# Patient Record
Sex: Male | Born: 1980
Health system: Southern US, Community
[De-identification: ages and names within clinical notes are randomized; demographics above are authoritative.]

## PROBLEM LIST (undated history)

## (undated) DIAGNOSIS — I10 Essential (primary) hypertension: Secondary | ICD-10-CM

## (undated) DIAGNOSIS — E119 Type 2 diabetes mellitus without complications: Secondary | ICD-10-CM

## (undated) HISTORY — DX: Type 2 diabetes mellitus without complications: E11.9

---

## 2004-02-05 ENCOUNTER — Emergency Department (HOSPITAL_COMMUNITY): Admission: EM | Admit: 2004-02-05 | Discharge: 2004-02-05 | Payer: Self-pay | Admitting: Family Medicine

## 2008-12-05 ENCOUNTER — Emergency Department (HOSPITAL_COMMUNITY): Admission: EM | Admit: 2008-12-05 | Discharge: 2008-12-05 | Payer: Self-pay | Admitting: Family Medicine

## 2009-06-19 ENCOUNTER — Emergency Department (HOSPITAL_COMMUNITY): Admission: EM | Admit: 2009-06-19 | Discharge: 2009-06-19 | Payer: Self-pay | Admitting: Family Medicine

## 2010-09-05 ENCOUNTER — Emergency Department (HOSPITAL_COMMUNITY): Payer: BC Managed Care – PPO

## 2010-09-05 ENCOUNTER — Emergency Department (HOSPITAL_COMMUNITY)
Admission: EM | Admit: 2010-09-05 | Discharge: 2010-09-05 | Disposition: A | Discharge status: Home / Self Care | Payer: BC Managed Care – PPO | Attending: Emergency Medicine | Admitting: Emergency Medicine

## 2010-09-05 DIAGNOSIS — IMO0002 Reserved for concepts with insufficient information to code with codable children: Secondary | ICD-10-CM | POA: Insufficient documentation

## 2010-09-05 DIAGNOSIS — M25519 Pain in unspecified shoulder: Secondary | ICD-10-CM | POA: Insufficient documentation

## 2010-09-05 DIAGNOSIS — S0003XA Contusion of scalp, initial encounter: Secondary | ICD-10-CM | POA: Insufficient documentation

## 2010-09-05 DIAGNOSIS — Z23 Encounter for immunization: Secondary | ICD-10-CM | POA: Insufficient documentation

## 2010-09-05 DIAGNOSIS — S1093XA Contusion of unspecified part of neck, initial encounter: Secondary | ICD-10-CM | POA: Insufficient documentation

## 2010-09-05 DIAGNOSIS — S0990XA Unspecified injury of head, initial encounter: Secondary | ICD-10-CM | POA: Insufficient documentation

## 2012-08-12 ENCOUNTER — Emergency Department (HOSPITAL_COMMUNITY)
Admission: EM | Admit: 2012-08-12 | Discharge: 2012-08-13 | Disposition: A | Discharge status: Home / Self Care | Payer: BC Managed Care – PPO | Attending: Emergency Medicine | Admitting: Emergency Medicine

## 2012-08-12 ENCOUNTER — Encounter (HOSPITAL_COMMUNITY): Payer: Self-pay | Admitting: Emergency Medicine

## 2012-08-12 ENCOUNTER — Emergency Department (HOSPITAL_COMMUNITY): Payer: BC Managed Care – PPO

## 2012-08-12 DIAGNOSIS — Z720 Tobacco use: Secondary | ICD-10-CM

## 2012-08-12 DIAGNOSIS — F17200 Nicotine dependence, unspecified, uncomplicated: Secondary | ICD-10-CM | POA: Insufficient documentation

## 2012-08-12 DIAGNOSIS — R0789 Other chest pain: Secondary | ICD-10-CM | POA: Insufficient documentation

## 2012-08-12 DIAGNOSIS — R739 Hyperglycemia, unspecified: Secondary | ICD-10-CM

## 2012-08-12 DIAGNOSIS — I10 Essential (primary) hypertension: Secondary | ICD-10-CM | POA: Insufficient documentation

## 2012-08-12 DIAGNOSIS — R7309 Other abnormal glucose: Secondary | ICD-10-CM | POA: Insufficient documentation

## 2012-08-12 HISTORY — DX: Essential (primary) hypertension: I10

## 2012-08-12 LAB — CBC
HCT: 41.5 % (ref 39.0–52.0)
Hemoglobin: 14.7 g/dL (ref 13.0–17.0)
MCH: 31.3 pg (ref 26.0–34.0)
MCHC: 35.4 g/dL (ref 30.0–36.0)
MCV: 88.5 fL (ref 78.0–100.0)

## 2012-08-12 LAB — BASIC METABOLIC PANEL
BUN: 13 mg/dL (ref 6–23)
CO2: 25 mEq/L (ref 19–32)
Calcium: 9.2 mg/dL (ref 8.4–10.5)
Creatinine, Ser: 0.82 mg/dL (ref 0.50–1.35)
GFR calc non Af Amer: >90 mL/min (ref >90)
Glucose, Bld: 139 mg/dL — ABNORMAL HIGH (ref 70–99)

## 2012-08-12 NOTE — ED Notes (Signed)
PT. REPORTS LEFT CHEST PAIN ONSET LAST NIGHT , DENIES SOB , NO NAUSEA OR DIAPHORESIS .

## 2012-08-12 NOTE — ED Provider Notes (Signed)
History    CSN: [id number] Arrival date & time 08/12/12  2019  First MD Initiated Contact with Patient 08/12/12 2348     Chief Complaint  Patient presents with  . Chest Pain   (Consider location/radiation/quality/duration/timing/severity/associated sxs/prior Treatment) Patient is a 32 y.o. male presenting with chest pain. The history is provided by the patient.  Chest Pain He has been having episodes of chest pain since yesterday afternoon. Pain will last for anywhere from 5-20 seconds before resolving. It is a sharp pain in the left anterior chest without radiation. He rates the pain at 10/10 when present. Nothing makes the pain worse, but it is better with walking. There is no associated dyspnea, nausea, diaphoresis. He is a cigarette smoker admitting to smoking one pack of cigarettes a day. He has history of hypertension which was diagnosed 2 years ago but has never been on any medications. When he has had his blood pressure checked, it has always been elevated although he cannot recall specific numbers. He denies history of diabetes, hyperlipidemia, and denies family history of coronary artery disease. Past Medical History  Diagnosis Date  . Hypertension    History reviewed. No pertinent past surgical history. No family history on file. History  Substance Use Topics  . Smoking status: Current Every Day Smoker  . Smokeless tobacco: Not on file  . Alcohol Use: Yes    Review of Systems  Cardiovascular: Positive for chest pain.  All other systems reviewed and are negative.    Allergies  Review of patient's allergies indicates no known allergies.  Home Medications  No current outpatient prescriptions on file. BP 188/85  Pulse 98  Temp(Src) 97.9 F (36.6 C) (Oral)  Resp 14  SpO2 98% Physical Exam  Nursing note and vitals reviewed.  32 year old male, resting comfortably and in no acute distress. Vital signs are significant for hypertension with blood pressure 188/85.  Oxygen saturation is 98%, which is normal. Head is normocephalic and atraumatic. PERRLA, EOMI. Oropharynx is clear. Neck is nontender and supple without adenopathy or JVD. Back is nontender and there is no CVA tenderness. Lungs are clear without rales, wheezes, or rhonchi. Chest is nontender. Heart has regular rate and rhythm without murmur. Abdomen is soft, flat, nontender without masses or hepatosplenomegaly and peristalsis is normoactive. Extremities have no cyanosis or edema, full range of motion is present. Skin is warm and dry without rash. Neurologic: Mental status is normal, cranial nerves are intact, there are no motor or sensory deficits.  ED Course  Procedures (including critical care time) Results for orders placed during the hospital encounter of 08/12/12  CBC      Result Value Range   WBC 6.0  4.0 - 10.5 K/uL   RBC 4.69  4.22 - 5.81 MIL/uL   Hemoglobin 14.7  13.0 - 17.0 g/dL   HCT 40.9  81.1 - 91.4 %   MCV 88.5  78.0 - 100.0 fL   MCH 31.3  26.0 - 34.0 pg   MCHC 35.4  30.0 - 36.0 g/dL   RDW 78.2  95.6 - 21.3 %   Platelets 158  150 - 400 K/uL  BASIC METABOLIC PANEL      Result Value Range   Sodium 140  135 - 145 mEq/L   Potassium 3.5  3.5 - 5.1 mEq/L   Chloride 104  96 - 112 mEq/L   CO2 25  19 - 32 mEq/L   Glucose, Bld 139 (*) 70 - 99 mg/dL  BUN 13  6 - 23 mg/dL   Creatinine, Ser 0.98  0.50 - 1.35 mg/dL   Calcium 9.2  8.4 - 11.9 mg/dL   GFR calc non Af Amer >90  >90 mL/min   GFR calc Af Amer >90  >90 mL/min  PRO B NATRIURETIC PEPTIDE      Result Value Range   Pro B Natriuretic peptide (BNP) <5.0  0 - 125 pg/mL  POCT I-STAT TROPONIN I      Result Value Range   Troponin i, poc 0.00  0.00 - 0.08 ng/mL   Comment 3            Dg Chest 2 View  08/12/2012   *RADIOLOGY REPORT*  Clinical Data: Chest pain underneath the left breast.  CHEST - 2 VIEW  Comparison:  None.  Findings:  The heart size and mediastinal contours are within normal limits.  Both lungs are clear.   The visualized skeletal structures are unremarkable.  IMPRESSION: No active cardiopulmonary disease.   Original Report Authenticated By: Richarda Overlie, M.D.     Date: 08/12/2012  Rate: 91  Rhythm: normal sinus rhythm  QRS Axis: normal  Intervals: normal  ST/T Wave abnormalities: normal  Conduction Disutrbances:none  Narrative Interpretation: Normal ECG, no prior ECG available for comparison.  Old EKG Reviewed: none available   1. Atypical chest pain   2. Hypertension   3. Hyperglycemia   4. Tobacco abuse     MDM  Atypical chest pain inpatient does have risk factors of hypertension and tobacco abuse. However, his pattern of pain is not worrisome for coronary artery disease. I am concerned about his blood pressure. He states that he has been diagnosed with hypertension for 2 years and blood pressure is always high when he checks it at a drugstore. I will give him a prescription for lisinopril and he is advised to obtain a PCP. Note is also made of mild hyperglycemia. This will need to be a home monitor to make sure he is not nearly stages of diabetes. He is advised to stop smoking and go on a low-salt diet.   Dione Booze, MD 08/13/12 [phone]

## 2012-08-13 MED ORDER — LISINOPRIL 10 MG PO TABS: 10.0000 mg | ORAL_TABLET | Freq: Every day | ORAL | Status: DC

## 2013-06-09 ENCOUNTER — Encounter (HOSPITAL_COMMUNITY): Payer: Self-pay | Admitting: Emergency Medicine

## 2013-06-09 ENCOUNTER — Emergency Department (HOSPITAL_COMMUNITY): Payer: BC Managed Care – PPO

## 2013-06-09 ENCOUNTER — Emergency Department (INDEPENDENT_AMBULATORY_CARE_PROVIDER_SITE_OTHER)
Admission: EM | Admit: 2013-06-09 | Discharge: 2013-06-09 | Disposition: A | Discharge status: Nursing Home (Includes ICF) | Payer: BC Managed Care – PPO | Source: Home / Self Care | Attending: Family Medicine | Admitting: Family Medicine

## 2013-06-09 ENCOUNTER — Emergency Department (HOSPITAL_COMMUNITY)
Admission: EM | Admit: 2013-06-09 | Discharge: 2013-06-09 | Disposition: A | Discharge status: Home / Self Care | Payer: BC Managed Care – PPO | Attending: Emergency Medicine | Admitting: Emergency Medicine

## 2013-06-09 DIAGNOSIS — R0789 Other chest pain: Secondary | ICD-10-CM

## 2013-06-09 DIAGNOSIS — R0602 Shortness of breath: Secondary | ICD-10-CM | POA: Insufficient documentation

## 2013-06-09 DIAGNOSIS — R079 Chest pain, unspecified: Secondary | ICD-10-CM | POA: Insufficient documentation

## 2013-06-09 DIAGNOSIS — I10 Essential (primary) hypertension: Secondary | ICD-10-CM | POA: Insufficient documentation

## 2013-06-09 DIAGNOSIS — F17200 Nicotine dependence, unspecified, uncomplicated: Secondary | ICD-10-CM | POA: Insufficient documentation

## 2013-06-09 LAB — BASIC METABOLIC PANEL
BUN: 9 mg/dL (ref 6–23)
CHLORIDE: 102 meq/L (ref 96–112)
CO2: 28 mEq/L (ref 19–32)
CREATININE: 0.81 mg/dL (ref 0.50–1.35)
Calcium: 9.3 mg/dL (ref 8.4–10.5)
GFR calc non Af Amer: >90 mL/min (ref >90)
Glucose, Bld: 111 mg/dL — ABNORMAL HIGH (ref 70–99)
POTASSIUM: 4.1 meq/L (ref 3.7–5.3)
SODIUM: 141 meq/L (ref 137–147)

## 2013-06-09 LAB — CBC
HCT: 44 % (ref 39.0–52.0)
Hemoglobin: 15.3 g/dL (ref 13.0–17.0)
MCH: 31.5 pg (ref 26.0–34.0)
MCHC: 34.8 g/dL (ref 30.0–36.0)
MCV: 90.7 fL (ref 78.0–100.0)
PLATELETS: 148 10*3/uL — AB (ref 150–400)
RBC: 4.85 MIL/uL (ref 4.22–5.81)
RDW: 12.6 % (ref 11.5–15.5)
WBC: 4.2 10*3/uL (ref 4.0–10.5)

## 2013-06-09 LAB — I-STAT TROPONIN, ED
TROPONIN I, POC: 0 ng/mL (ref 0.00–0.08)
TROPONIN I, POC: 0 ng/mL (ref 0.00–0.08)

## 2013-06-09 MED ORDER — IBUPROFEN 800 MG PO TABS
800.0000 mg | ORAL_TABLET | Freq: Once | ORAL | Status: AC
  Administered 2013-06-09: 800 mg via ORAL
  Filled 2013-06-09: qty 1

## 2013-06-09 MED ORDER — IBUPROFEN 800 MG PO TABS: 800.0000 mg | ORAL_TABLET | Freq: Three times a day (TID) | ORAL | Status: DC

## 2013-06-09 NOTE — ED Notes (Signed)
Pt has been brought back Sx have not changed Alert; no signs of acute distress.

## 2013-06-09 NOTE — ED Provider Notes (Signed)
CSN: [id number]     Arrival date & time 06/09/13  1328 History   First MD Initiated Contact with Patient 06/09/13 1448     Chief Complaint  Patient presents with  . Chest Pain   (Consider location/radiation/quality/duration/timing/severity/associated sxs/prior Treatment) HPI Comments: When seen for same in ER in July 2014, was diagnosed with HTN and prescribed lisinopril and was advised to locate PCP for follow up. Patient decided he did not want to take lisinopril because his brother stated to him that when he took lisinopril "it made him feel funny." Has also not made and effort to contact/follow up with his PCP. [facility] Denies any recent illness or injury  Patient is a 33 y.o. male presenting with chest pain. The history is provided by the patient.  Chest Pain Pain location:  L chest Pain quality: sharp   Pain radiates to:  Does not radiate Pain radiates to the back: no   Pain severity:  Moderate Onset quality:  Sudden Duration:  1 week (each episode lasts 10-20 seconds) Timing:  Intermittent Progression:  Waxing and waning Chronicity: seen for same in ED in 07/2012. Context comment:  States he is often most aware of his symptoms when at rest Associated symptoms: palpitations and shortness of breath   Associated symptoms: no abdominal pain, no anxiety, no back pain, no cough, no diaphoresis, no dizziness, no dysphagia, no fatigue, no fever, no heartburn, no nausea, no near-syncope, no numbness, no orthopnea, no syncope, not vomiting and no weakness   Risk factors: hypertension, male sex and smoking     Past Medical History  Diagnosis Date  . Hypertension    History reviewed. No pertinent past surgical history. No family history on file. History  Substance Use Topics  . Smoking status: Current Every Day Smoker  . Smokeless tobacco: Not on file  . Alcohol Use: Yes    Review of Systems  Constitutional: Negative for fever, diaphoresis and fatigue.  HENT:  Negative for trouble swallowing.   Respiratory: Positive for shortness of breath. Negative for cough.   Cardiovascular: Positive for chest pain and palpitations. Negative for orthopnea, syncope and near-syncope.  Gastrointestinal: Negative for heartburn, nausea, vomiting and abdominal pain.  Musculoskeletal: Negative for back pain.  Neurological: Negative for dizziness, weakness and numbness.  All other systems reviewed and are negative.   Allergies  Review of patient's allergies indicates no known allergies.  Home Medications   Prior to Admission medications   Medication Sig Start Date End Date Taking? Authorizing Provider  lisinopril (PRINIVIL,ZESTRIL) 10 MG tablet Take 1 tablet (10 mg total) by mouth daily. 08/13/12   Dione Boozeavid Glick, MD   BP 147/70  Pulse 99  Temp(Src) 97.8 F (36.6 C) (Oral)  Resp 18  SpO2 99% Physical Exam  Nursing note and vitals reviewed. Constitutional: He is oriented to person, place, and time. He appears well-developed and well-nourished.  HENT:  Head: Normocephalic and atraumatic.  Eyes: Conjunctivae are normal. No scleral icterus.  Neck: Normal range of motion. Neck supple. No JVD present.  Cardiovascular: Normal rate, regular rhythm and normal heart sounds.   Pulmonary/Chest: Effort normal and breath sounds normal.  Abdominal: Soft. Bowel sounds are normal. He exhibits no distension. There is no tenderness.  Musculoskeletal: Normal range of motion. He exhibits no edema and no tenderness.  Neurological: He is alert and oriented to person, place, and time.  Skin: Skin is warm and dry. No rash noted. No erythema.  Psychiatric: He has a normal mood  and affect. His behavior is normal.    ED Course  Procedures (including critical care time) Labs Review Labs Reviewed - No data to display  Imaging Review No results found.   MDM   1. Chest pain, atypical    33 y/o AA male with history of HTN and medical non-compliance with atypical chest pain.  Symptom free at time of exam. Will send to ER for probable troponin, risk stratification and period of observation.    Jess BartersJennifer Lee AllertonPresson, [place] 06/09/13 [phone]

## 2013-06-09 NOTE — ED Provider Notes (Signed)
CSN: [id number]     Arrival date & time 06/09/13  1530 History   First MD Initiated Contact with Patient 06/09/13 2021     Chief Complaint  Patient presents with  . Chest Pain     (Consider location/radiation/quality/duration/timing/severity/associated sxs/prior Treatment) HPI 33 year old male presents with chest pain is been ongoing for a week. States is intermittent. It feels sharp and lasts about 10-20 seconds at a time. He states some days he doesn't have any of the pain and sometimes the pain seems to come and go very frequently. He had pain similar to this about a year ago with tightness of hypertension. He was started on blood pressure medicines but states he didn't take them because his brother told him not to. He states it does get short of breath with it. No pleuritic symptoms, nausea, diaphoresis, or dizziness. Denies any vomiting. He is currently asymptomatic has been a symptomatic since ER arrival. Denies any history of family history of cardiac disease, history of hyperlipidemia, drug use. No leg swelling. He's been having congestion without cough since this started.  Past Medical History  Diagnosis Date  . Hypertension    History reviewed. No pertinent past surgical history. History reviewed. No pertinent family history. History  Substance Use Topics  . Smoking status: Current Every Day Smoker  . Smokeless tobacco: Not on file  . Alcohol Use: Yes    Review of Systems  Constitutional: Negative for fever and diaphoresis.  Respiratory: Positive for shortness of breath.   Cardiovascular: Positive for chest pain. Negative for palpitations and leg swelling.  Gastrointestinal: Negative for nausea, vomiting and abdominal pain.  Neurological: Negative for weakness.  All other systems reviewed and are negative.     Allergies  Review of patient's allergies indicates no known allergies.  Home Medications   Prior to Admission medications   Medication Sig Start Date End  Date Taking? Authorizing Provider  ibuprofen (ADVIL,MOTRIN) 800 MG tablet Take 1 tablet (800 mg total) by mouth 3 (three) times daily. 06/09/13   Audree CamelScott T Puja Caffey, MD   BP 128/67  Pulse 70  Temp(Src) 97.5 F (36.4 C) (Oral)  Resp 17  Ht 5\' 11"  (1.803 m)  Wt 254 lb 4 oz (115.327 kg)  BMI 35.48 kg/m2  SpO2 95% Physical Exam  Nursing note and vitals reviewed. Constitutional: He is oriented to person, place, and time. He appears well-developed and well-nourished.  HENT:  Head: Normocephalic and atraumatic.  Right Ear: External ear normal.  Left Ear: External ear normal.  Nose: Nose normal.  Eyes: Right eye exhibits no discharge. Left eye exhibits no discharge.  Neck: Neck supple.  Cardiovascular: Normal rate, regular rhythm, normal heart sounds and intact distal pulses.   Pulmonary/Chest: Effort normal and breath sounds normal. He exhibits no tenderness.  Abdominal: Soft. There is no tenderness.  Musculoskeletal: He exhibits no edema.  Neurological: He is alert and oriented to person, place, and time.  Skin: Skin is warm and dry.    ED Course  Procedures (including critical care time) Labs Review Labs Reviewed  CBC - Abnormal; Notable for the following:    Platelets 148 (*)    All other components within normal limits  BASIC METABOLIC PANEL - Abnormal; Notable for the following:    Glucose, Bld 111 (*)    All other components within normal limits  I-STAT TROPOININ, ED  Rosezena SensorI-STAT TROPOININ, ED    Imaging Review Dg Chest 2 View  06/09/2013   CLINICAL DATA:  Intermittent sharp left-sided  chest pain  EXAM: CHEST  2 VIEW  COMPARISON:  Prior radiograph from 08/12/2012  FINDINGS: The cardiac and mediastinal silhouettes are stable in size and contour, and remain within normal limits.  The lungs are normally inflated. No airspace consolidation, pleural effusion, or pulmonary edema is identified. There is no pneumothorax.  No acute osseous abnormality identified.  IMPRESSION: No acute  cardiopulmonary abnormality.   Electronically Signed   By: Rise MuBenjamin  McClintock M.D.   On: 06/09/2013 16:14    MUSE not working properly   Date: 06/09/2013  Rate: 68  Rhythm: normal sinus rhythm  QRS Axis: normal  Intervals: normal  ST/T Wave abnormalities: early repolarization  Conduction Disutrbances:none  Narrative Interpretation: ST elevation consistent with early repol, no reciprocal changes  Old EKG Reviewed: unchanged    MDM   Final diagnoses:  Chest pain    Patient's EKG shows ST elevation c/w early repol. Given his 2 negative troponins and no chest pain while in ED I feel ACS is very unlikely. Not hypertensive here. No other risk factors besides smoking. Low risk for PE and is PERC negative, thus PE seems very unlikely. Is atypical, with sharp being its quality. Is nonspecific but at this time not concerning given his lack of risk factors. Will treat with ibuprofen and discharge. Has been arranging PCP for primary care, will follow up with them.    Audree CamelScott T Dechelle Attaway, MD 06/09/13 [phone]

## 2013-06-09 NOTE — ED Notes (Signed)
Asked by front staff to assess pt Pt c/o intermittent CP onset 1 week At the moment; denies pain Denies: SOB, diaphoresis, weakness, vomiting Smokes 0.5 PPD; seen at [place] for similar sx He is alert and talking in complete sentences w/no signs of acute distress Adv pt to wait in the lobby and to notify front staff if sx change.  Pt verbalized understanding.

## 2013-06-09 NOTE — ED Notes (Signed)
Presents with left sided chest pain began one week ago described as sharp and intermittent, rest makes pain better, nothing makes pain worse. Pain lasts approx 10-20 seconds. Associated with SOB, denies nausea, dizziness and radiation.

## 2013-06-09 NOTE — Discharge Instructions (Signed)
Chest Pain (Nonspecific) °It is often hard to give a specific diagnosis for the cause of chest pain. There is always a chance that your pain could be related to something serious, such as a heart attack or a blood clot in the lungs. You need to follow up with your caregiver for further evaluation. °CAUSES  °· Heartburn. °· Pneumonia or bronchitis. °· Anxiety or stress. °· Inflammation around your heart (pericarditis) or lung (pleuritis or pleurisy). °· A blood clot in the lung. °· A collapsed lung (pneumothorax). It can develop suddenly on its own (spontaneous pneumothorax) or from injury (trauma) to the chest. °· Shingles infection (herpes zoster virus). °The chest wall is composed of bones, muscles, and cartilage. Any of these can be the source of the pain. °· The bones can be bruised by injury. °· The muscles or cartilage can be strained by coughing or overwork. °· The cartilage can be affected by inflammation and become sore (costochondritis). °DIAGNOSIS  °Lab tests or other studies, such as X-rays, electrocardiography, stress testing, or cardiac imaging, may be needed to find the cause of your pain.  °TREATMENT  °· Treatment depends on what may be causing your chest pain. Treatment may include: °· Acid blockers for heartburn. °· Anti-inflammatory medicine. °· Pain medicine for inflammatory conditions. °· Antibiotics if an infection is present. °· You may be advised to change lifestyle habits. This includes stopping smoking and avoiding alcohol, caffeine, and chocolate. °· You may be advised to keep your head raised (elevated) when sleeping. This reduces the chance of acid going backward from your stomach into your esophagus. °· Most of the time, nonspecific chest pain will improve within 2 to 3 days with rest and mild pain medicine. °HOME CARE INSTRUCTIONS  °· If antibiotics were prescribed, take your antibiotics as directed. Finish them even if you start to feel better. °· For the next few days, avoid physical  activities that bring on chest pain. Continue physical activities as directed. °· Do not smoke. °· Avoid drinking alcohol. °· Only take over-the-counter or prescription medicine for pain, discomfort, or fever as directed by your caregiver. °· Follow your caregiver's suggestions for further testing if your chest pain does not go away. °· Keep any follow-up appointments you made. If you do not go to an appointment, you could develop lasting (chronic) problems with pain. If there is any problem keeping an appointment, you must call to reschedule. °SEEK MEDICAL CARE IF:  °· You think you are having problems from the medicine you are taking. Read your medicine instructions carefully. °· Your chest pain does not go away, even after treatment. °· You develop a rash with blisters on your chest. °SEEK IMMEDIATE MEDICAL CARE IF:  °· You have increased chest pain or pain that spreads to your arm, neck, jaw, back, or abdomen. °· You develop shortness of breath, an increasing cough, or you are coughing up blood. °· You have severe back or abdominal pain, feel nauseous, or vomit. °· You develop severe weakness, fainting, or chills. °· You have a fever. °THIS IS AN EMERGENCY. Do not wait to see if the pain will go away. Get medical help at once. Call your local emergency services (911 in [place] Do not drive yourself to the hospital. °MAKE SURE YOU:  °· Understand these instructions. °· Will watch your condition. °· Will get help right away if you are not doing well or get worse. °Document Released: 10/26/2004 Document Revised: 04/10/2011 Document Reviewed: 08/22/2007 °ExitCare® Patient Information ©2014 [employer],   LL[employer]. ° °

## 2013-06-12 NOTE — ED Provider Notes (Signed)
Medical screening examination/treatment/procedure(s) were performed by a resident physician or non-physician practitioner and as the supervising physician I was immediately available for consultation/collaboration.  Yaziel Brandon, MD    Shain Pauwels S [place] Espina, MD 06/12/13 0809 

## 2014-04-28 ENCOUNTER — Encounter (HOSPITAL_COMMUNITY): Payer: Self-pay | Admitting: Emergency Medicine

## 2014-04-28 ENCOUNTER — Emergency Department (HOSPITAL_COMMUNITY)
Admission: EM | Admit: 2014-04-28 | Discharge: 2014-04-28 | Disposition: A | Discharge status: Home / Self Care | Payer: BLUE CROSS/BLUE SHIELD | Source: Home / Self Care | Attending: Family Medicine | Admitting: Family Medicine

## 2014-04-28 DIAGNOSIS — L42 Pityriasis rosea: Secondary | ICD-10-CM | POA: Diagnosis not present

## 2014-04-28 MED ORDER — PREDNISONE 50 MG PO TABS: ORAL_TABLET | ORAL | Status: DC

## 2014-04-28 MED ORDER — ACYCLOVIR 400 MG PO TABS
400.0000 mg | ORAL_TABLET | Freq: Every day | ORAL | Status: DC
Start: 2014-04-28 — End: 2014-12-07

## 2014-04-28 NOTE — Discharge Instructions (Signed)
You have Pityriasis Rosea. This should go away with time but your symptoms may be helped with steroids, oral benadryl and acyclovir. Only start the acyclovir if the other medications do not work.   Pityriasis Rosea Pityriasis rosea is a rash which is probably caused by a virus. It generally starts as a scaly, red patch on the trunk (the area of the body that a t-shirt would cover) but does not appear on sun exposed areas. The rash is usually preceded by an initial larger spot called the "herald patch" a week or more before the rest of the rash appears. Generally within one to two days the rash appears rapidly on the trunk, upper arms, and sometimes the upper legs. The rash usually appears as flat, oval patches of scaly pink color. The rash can also be raised and one is able to feel it with a finger. The rash can also be finely crinkled and may slough off leaving a ring of scale around the spot. Sometimes a mild sore throat is present with the rash. It usually affects children and young adults in the spring and autumn. Women are more frequently affected than men. TREATMENT  Pityriasis rosea is a self-limited condition. This means it goes away within 4 to 8 weeks without treatment. The spots may persist for several months, especially in darker-colored skin after the rash has resolved and healed. Benadryl and steroid creams may be used if itching is a problem. SEEK MEDICAL CARE IF:   Your rash does not go away or persists longer than three months.  You develop fever and joint pain.  You develop severe headache and confusion.  You develop breathing difficulty, vomiting and/or extreme weakness. Document Released: 02/22/2001 Document Revised: 04/10/2011 Document Reviewed: 03/13/2008 [facility] Patient Information 2015 [employer], [place]. This information is not intended to replace advice given to you by your health care provider. Make sure you discuss any questions you have with your health care provider.

## 2014-04-28 NOTE — ED Notes (Signed)
Rash x 1 week.

## 2014-04-28 NOTE — ED Provider Notes (Signed)
CSN: [id number]     Arrival date & time 04/28/14  1110 History   First MD Initiated Contact with Patient 04/28/14 1250     Chief Complaint  Patient presents with  . Rash   (Consider location/radiation/quality/duration/timing/severity/associated sxs/prior Treatment) HPI  Rash: 10 days ago. Started on upper chest. Now involving entire trunk and back. Initially itchy, but as lesion becomes old it no longer itches. New lesions itch. Steroid cream w/o benefit. Denies recent cold symptoms or other infection. No one else w/ rash. Constant and getting worse.   Previously Dx as pre-DM and started on metformin. Pt stopped as these were not tolerated from a GI standpoint  Past Medical History  Diagnosis Date  . Hypertension    History reviewed. No pertinent past surgical history. No family history on file. History  Substance Use Topics  . Smoking status: Current Every Day Smoker  . Smokeless tobacco: Not on file  . Alcohol Use: Yes    Review of Systems Per HPI with all other pertinent systems negative.   Allergies  Review of patient's allergies indicates no known allergies.  Home Medications   Prior to Admission medications   Medication Sig Start Date End Date Taking? Authorizing Provider  acyclovir (ZOVIRAX) 400 MG tablet Take 1 tablet (400 mg total) by mouth 5 (five) times daily. 04/28/14   Ozella Rocksavid J Nadiyah Zeis, MD  ibuprofen (ADVIL,MOTRIN) 800 MG tablet Take 1 tablet (800 mg total) by mouth 3 (three) times daily. 06/09/13   Pricilla LovelessScott Goldston, MD  predniSONE (DELTASONE) 50 MG tablet Take daily with breakfast 04/28/14   Ozella Rocksavid J Tayten Bergdoll, MD   BP 144/78 mmHg  Pulse 81  Temp(Src) 98.3 F (36.8 C) (Oral)  Resp 12  SpO2 100% Physical Exam Physical Exam  Constitutional: oriented to person, place, and time. appears well-developed and well-nourished. No distress.  HENT:  Head: Normocephalic and atraumatic.  Eyes: EOMI. PERRL.  Neck: Normal range of motion.  Cardiovascular: RRR, no m/r/g, 2+  distal pulses,  Pulmonary/Chest: Effort normal and breath sounds normal. No respiratory distress.  Abdominal: Soft. Bowel sounds are normal. NonTTP, no distension.  Musculoskeletal: Normal range of motion. Non ttp, no effusion.  Neurological: alert and oriented to person, place, and time.  Skin: Diffuse macular lesions on chest and back. Single large raised lesion with some scaling in the upper left chest consistent with a herald patch  Psychiatric: normal mood and affect. behavior is normal. Judgment and thought content normal.   ED Course  Procedures (including critical care time) Labs Review Labs Reviewed - No data to display  Imaging Review No results found.   MDM   1. Pityriasis rosea    Patient has tried topical steroid cream without significant benefit. We'll try a short course of oral steroids. Patient to be mindful of sugar levels as he is prediabetic. We'll also try course of acyclovir 400 mg 5 times a day for additional benefit. Patient given educational material.  Precautions given and all questions answered  Shelly Flattenavid Meldrick Buttery, MD Family Medicine 04/28/2014, 1:09 PM      Ozella Rocksavid J Evelia Waskey, MD 04/28/14 1309

## 2014-12-07 ENCOUNTER — Emergency Department (HOSPITAL_COMMUNITY)
Admission: EM | Admit: 2014-12-07 | Discharge: 2014-12-07 | Disposition: A | Discharge status: Home / Self Care | Payer: Managed Care, Other (non HMO) | Attending: Emergency Medicine | Admitting: Emergency Medicine

## 2014-12-07 ENCOUNTER — Encounter (HOSPITAL_COMMUNITY): Payer: Self-pay | Admitting: Emergency Medicine

## 2014-12-07 ENCOUNTER — Emergency Department (HOSPITAL_COMMUNITY): Payer: Managed Care, Other (non HMO)

## 2014-12-07 DIAGNOSIS — R079 Chest pain, unspecified: Secondary | ICD-10-CM

## 2014-12-07 DIAGNOSIS — R0789 Other chest pain: Secondary | ICD-10-CM | POA: Diagnosis not present

## 2014-12-07 DIAGNOSIS — I10 Essential (primary) hypertension: Secondary | ICD-10-CM | POA: Insufficient documentation

## 2014-12-07 DIAGNOSIS — Z720 Tobacco use: Secondary | ICD-10-CM | POA: Diagnosis not present

## 2014-12-07 DIAGNOSIS — R0602 Shortness of breath: Secondary | ICD-10-CM | POA: Insufficient documentation

## 2014-12-07 LAB — I-STAT TROPONIN, ED: Troponin i, poc: 0.01 ng/mL (ref 0.00–0.08)

## 2014-12-07 LAB — CBC
HEMATOCRIT: 46.4 % (ref 39.0–52.0)
Hemoglobin: 15.9 g/dL (ref 13.0–17.0)
MCH: 31.9 pg (ref 26.0–34.0)
MCHC: 34.3 g/dL (ref 30.0–36.0)
MCV: 93.2 fL (ref 78.0–100.0)
Platelets: 146 10*3/uL — ABNORMAL LOW (ref 150–400)
RBC: 4.98 MIL/uL (ref 4.22–5.81)
RDW: 12.7 % (ref 11.5–15.5)
WBC: 6 10*3/uL (ref 4.0–10.5)

## 2014-12-07 LAB — BASIC METABOLIC PANEL
ANION GAP: 5 (ref 5–15)
BUN: 14 mg/dL (ref 6–20)
CHLORIDE: 108 mmol/L (ref 101–111)
CO2: 27 mmol/L (ref 22–32)
CREATININE: 1.03 mg/dL (ref 0.61–1.24)
Calcium: 9.5 mg/dL (ref 8.9–10.3)
GFR calc Af Amer: >60 mL/min (ref >60)
GFR calc non Af Amer: >60 mL/min (ref >60)
Glucose, Bld: 119 mg/dL — ABNORMAL HIGH (ref 65–99)
POTASSIUM: 4.2 mmol/L (ref 3.5–5.1)
Sodium: 140 mmol/L (ref 135–145)

## 2014-12-07 NOTE — ED Notes (Signed)
Per pt, states he has been having chest pain on and off for awhile-states he is pre-diabetic and might have HTN as well-has no primary

## 2014-12-07 NOTE — ED Provider Notes (Signed)
CSN: [id number]     Arrival date & time 12/07/14  1043 History   First MD Initiated Contact with Patient 12/07/14 1319     Chief Complaint  Patient presents with  . Chest Pain     (Consider location/radiation/quality/duration/timing/severity/associated sxs/prior Treatment) HPI Brendan Hodges is a 34 y.o. male with history of hypertension, smoker, presents to emergency department complaining of chest pain. Patient states that he has had left-sided chest pain on and off for a few weeks. States pain comes and goes independent of activity. It is not associated with eating or deep breaths. Pain is in the left side of the chest. It is sharp. It comes on lasting approximately 30 seconds to a minute and then resolves on its own. He admits to some shortness of breath during these episodes of pain, denies any dizziness, diaphoresis, nausea. He had similar pain a year ago and was evaluated in emergency department with no acute findings. He has not followed up since then. He denies taking any medications for this pain. He denies any swelling in his extremities. He denies any pain in his extremities. He denies any recent travel or surgeries. He does not have a primary care doctor at this time. He denies any family history of coronary artery disease. He is unsure of his cholesterol.  Past Medical History  Diagnosis Date  . Hypertension    History reviewed. No pertinent past surgical history. No family history on file. Social History  Substance Use Topics  . Smoking status: Current Every Day Smoker  . Smokeless tobacco: None  . Alcohol Use: Yes    Review of Systems  Constitutional: Negative for fever and chills.  Respiratory: Positive for chest tightness and shortness of breath. Negative for cough.   Cardiovascular: Positive for chest pain. Negative for palpitations and leg swelling.  Gastrointestinal: Negative for nausea, vomiting, abdominal pain, diarrhea and abdominal distention.   Genitourinary: Negative for dysuria, urgency, frequency and hematuria.  Musculoskeletal: Negative for myalgias, arthralgias, neck pain and neck stiffness.  Skin: Negative for rash.  Allergic/Immunologic: Negative for immunocompromised state.  Neurological: Negative for dizziness, weakness, light-headedness, numbness and headaches.      Allergies  Review of patient's allergies indicates no known allergies.  Home Medications   Prior to Admission medications   Not on File   BP 140/72 mmHg  Pulse 81  Temp(Src) 98.4 F (36.9 C) (Oral)  Resp 16  SpO2 97% Physical Exam  Constitutional: He appears well-developed and well-nourished. No distress.  HENT:  Head: Normocephalic and atraumatic.  Eyes: Conjunctivae are normal.  Neck: Neck supple.  Cardiovascular: Normal rate, regular rhythm and normal heart sounds.   Pulmonary/Chest: Effort normal. No respiratory distress. He has no wheezes. He has no rales. He exhibits no tenderness.  Abdominal: Soft. Bowel sounds are normal. He exhibits no distension. There is no tenderness. There is no rebound.  Musculoskeletal: He exhibits no edema.  Neurological: He is alert.  Skin: Skin is warm and dry.  Nursing note and vitals reviewed.   ED Course  Procedures (including critical care time) Labs Review Labs Reviewed  BASIC METABOLIC PANEL - Abnormal; Notable for the following:    Glucose, Bld 119 (*)    All other components within normal limits  CBC - Abnormal; Notable for the following:    Platelets 146 (*)    All other components within normal limits  I-STAT TROPOININ, ED    Imaging Review Dg Chest 2 View  12/07/2014  CLINICAL DATA:  Intermittent  chest pain EXAM: CHEST  2 VIEW COMPARISON:  Jun 09, 2013 FINDINGS: Lungs are clear. Heart size and pulmonary vascularity are normal. No adenopathy. No pneumothorax. No bone lesions. IMPRESSION: No abnormality noted. Electronically Signed   By: Bretta Bang III M.D.   On: 12/07/2014 14:05    I have personally reviewed and evaluated these images and lab results as part of my medical decision-making.   EKG Interpretation   Date/Time:  Monday December 07 2014 10:50:07 EST Ventricular Rate:  82 PR Interval:  182 QRS Duration: 86 QT Interval:  338 QTC Calculation: 395 R Axis:   2 Text Interpretation:  Sinus rhythm Benign early repolarization No  significant change since last tracing Confirmed by LIU MD, DANA ([id number]) on  12/07/2014 10:54:56 AM      MDM   Final diagnoses:  Chest pain, unspecified chest pain type    patient with atypical chest pain, it is nonexertional, lasts 30 seconds to minute time, sharp, goes away on its own. He is PERC negative. Afebrile. Not coughing. Will get labs, EKG, chest x-ray.  7:03 PM Patient is EKG, chest x-ray, labs all unremarkable. Pain is very atypical. Doubt coronary syndrome. He is hard score of 1. Patient admits to more strenuous activity at work, lifting heavy things. Most likely musculoskeletal pain. Will discharge home with close outpatient follow-up. He is ACE inhibitor medical this time. Return precautions discussed.  Filed Vitals:   12/07/14 1051 12/07/14 1422 12/07/14 1537  BP: 140/72 134/73 102/70  Pulse: 81 72 74  Temp: 98.4 F (36.9 C)  98.3 F (36.8 C)  TempSrc: Oral  Oral  Resp: SpO2: 97% 100% 99%       Jaynie Crumble, PA-C 12/08/14 1909  Lavera Guise, MD 12/09/14 [phone]

## 2014-12-07 NOTE — Discharge Instructions (Signed)
Ibuprofen/tylenol for pain. Make sure to get plenty of rest. Follow up with primary care doctor. Return if worsening symptoms.   Nonspecific Chest Pain  Chest pain can be caused by many different conditions. There is always a chance that your pain could be related to something serious, such as a heart attack or a blood clot in your lungs. Chest pain can also be caused by conditions that are not life-threatening. If you have chest pain, it is very important to follow up with your health care provider. CAUSES  Chest pain can be caused by:  Heartburn.  Pneumonia or bronchitis.  Anxiety or stress.  Inflammation around your heart (pericarditis) or lung (pleuritis or pleurisy).  A blood clot in your lung.  A collapsed lung (pneumothorax). It can develop suddenly on its own (spontaneous pneumothorax) or from trauma to the chest.  Shingles infection (varicella-zoster virus).  Heart attack.  Damage to the bones, muscles, and cartilage that make up your chest wall. This can include:  Bruised bones due to injury.  Strained muscles or cartilage due to frequent or repeated coughing or overwork.  Fracture to one or more ribs.  Sore cartilage due to inflammation (costochondritis). RISK FACTORS  Risk factors for chest pain may include:  Activities that increase your risk for trauma or injury to your chest.  Respiratory infections or conditions that cause frequent coughing.  Medical conditions or overeating that can cause heartburn.  Heart disease or family history of heart disease.  Conditions or health behaviors that increase your risk of developing a blood clot.  Having had chicken pox (varicella zoster). SIGNS AND SYMPTOMS Chest pain can feel like:  Burning or tingling on the surface of your chest or deep in your chest.  Crushing, pressure, aching, or squeezing pain.  Dull or sharp pain that is worse when you move, cough, or take a deep breath.  Pain that is also felt in  your back, neck, shoulder, or arm, or pain that spreads to any of these areas. Your chest pain may come and go, or it may stay constant. DIAGNOSIS Lab tests or other studies may be needed to find the cause of your pain. Your health care provider may have you take a test called an ambulatory ECG (electrocardiogram). An ECG records your heartbeat patterns at the time the test is performed. You may also have other tests, such as:  Transthoracic echocardiogram (TTE). During echocardiography, sound waves are used to create a picture of all of the heart structures and to look at how blood flows through your heart.  Transesophageal echocardiogram (TEE).This is a more advanced imaging test that obtains images from inside your body. It allows your health care provider to see your heart in finer detail.  Cardiac monitoring. This allows your health care provider to monitor your heart rate and rhythm in real time.  Holter monitor. This is a portable device that records your heartbeat and can help to diagnose abnormal heartbeats. It allows your health care provider to track your heart activity for several days, if needed.  Stress tests. These can be done through exercise or by taking medicine that makes your heart beat more quickly.  Blood tests.  Imaging tests. TREATMENT  Your treatment depends on what is causing your chest pain. Treatment may include:  Medicines. These may include:  Acid blockers for heartburn.  Anti-inflammatory medicine.  Pain medicine for inflammatory conditions.  Antibiotic medicine, if an infection is present.  Medicines to dissolve blood clots.  Medicines to  treat coronary artery disease.  Supportive care for conditions that do not require medicines. This may include:  Resting.  Applying heat or cold packs to injured areas.  Limiting activities until pain decreases. HOME CARE INSTRUCTIONS  If you were prescribed an antibiotic medicine, finish it all even if  you start to feel better.  Avoid any activities that bring on chest pain.  Do not use any tobacco products, including cigarettes, chewing tobacco, or electronic cigarettes. If you need help quitting, ask your health care provider.  Do not drink alcohol.  Take medicines only as directed by your health care provider.  Keep all follow-up visits as directed by your health care provider. This is important. This includes any further testing if your chest pain does not go away.  If heartburn is the cause for your chest pain, you may be told to keep your head raised (elevated) while sleeping. This reduces the chance that acid will go from your stomach into your esophagus.  Make lifestyle changes as directed by your health care provider. These may include:  Getting regular exercise. Ask your health care provider to suggest some activities that are safe for you.  Eating a heart-healthy diet. A registered dietitian can help you to learn healthy eating options.  Maintaining a healthy weight.  Managing diabetes, if necessary.  Reducing stress. SEEK MEDICAL CARE IF:  Your chest pain does not go away after treatment.  You have a rash with blisters on your chest.  You have a fever. SEEK IMMEDIATE MEDICAL CARE IF:   Your chest pain is worse.  You have an increasing cough, or you cough up blood.  You have severe abdominal pain.  You have severe weakness.  You faint.  You have chills.  You have sudden, unexplained chest discomfort.  You have sudden, unexplained discomfort in your arms, back, neck, or jaw.  You have shortness of breath at any time.  You suddenly start to sweat, or your skin gets clammy.  You feel nauseous or you vomit.  You suddenly feel light-headed or dizzy.  Your heart begins to beat quickly, or it feels like it is skipping beats. These symptoms may represent a serious problem that is an emergency. Do not wait to see if the symptoms will go away. Get medical  help right away. Call your local emergency services (911 in the [place] Do not drive yourself to the hospital.   This information is not intended to replace advice given to you by your health care provider. Make sure you discuss any questions you have with your health care provider.   Document Released: 10/26/2004 Document Revised: 02/06/2014 Document Reviewed: 08/22/2013 Elsevier Interactive Patient Education [employer]

## 2015-03-19 ENCOUNTER — Ambulatory Visit: Payer: BLUE CROSS/BLUE SHIELD | Admitting: Primary Care

## 2016-05-10 ENCOUNTER — Emergency Department (HOSPITAL_COMMUNITY)
Admission: EM | Admit: 2016-05-10 | Discharge: 2016-05-10 | Disposition: A | Discharge status: Home / Self Care | Payer: Managed Care, Other (non HMO) | Attending: Emergency Medicine | Admitting: Emergency Medicine

## 2016-05-10 ENCOUNTER — Encounter (HOSPITAL_COMMUNITY): Payer: Self-pay | Admitting: *Deleted

## 2016-05-10 DIAGNOSIS — Z7982 Long term (current) use of aspirin: Secondary | ICD-10-CM | POA: Diagnosis not present

## 2016-05-10 DIAGNOSIS — F17200 Nicotine dependence, unspecified, uncomplicated: Secondary | ICD-10-CM | POA: Diagnosis not present

## 2016-05-10 DIAGNOSIS — R1110 Vomiting, unspecified: Secondary | ICD-10-CM | POA: Diagnosis not present

## 2016-05-10 DIAGNOSIS — Z79899 Other long term (current) drug therapy: Secondary | ICD-10-CM | POA: Insufficient documentation

## 2016-05-10 DIAGNOSIS — I10 Essential (primary) hypertension: Secondary | ICD-10-CM | POA: Diagnosis not present

## 2016-05-10 DIAGNOSIS — R197 Diarrhea, unspecified: Secondary | ICD-10-CM | POA: Diagnosis not present

## 2016-05-10 LAB — COMPREHENSIVE METABOLIC PANEL
ALBUMIN: 4.6 g/dL (ref 3.5–5.0)
ALT: 39 U/L (ref 17–63)
AST: 33 U/L (ref 15–41)
Alkaline Phosphatase: 74 U/L (ref 38–126)
Anion gap: 7 (ref 5–15)
BUN: 11 mg/dL (ref 6–20)
CO2: 25 mmol/L (ref 22–32)
Calcium: 8.8 mg/dL — ABNORMAL LOW (ref 8.9–10.3)
Chloride: 107 mmol/L (ref 101–111)
Creatinine, Ser: 0.74 mg/dL (ref 0.61–1.24)
GFR calc Af Amer: >60 mL/min (ref >60)
GFR calc non Af Amer: >60 mL/min (ref >60)
GLUCOSE: 118 mg/dL — AB (ref 65–99)
POTASSIUM: 3.8 mmol/L (ref 3.5–5.1)
SODIUM: 139 mmol/L (ref 135–145)
Total Bilirubin: 0.8 mg/dL (ref 0.3–1.2)
Total Protein: 7.3 g/dL (ref 6.5–8.1)

## 2016-05-10 LAB — CBC
HEMATOCRIT: 44.2 % (ref 39.0–52.0)
HEMOGLOBIN: 15.6 g/dL (ref 13.0–17.0)
MCH: 31.6 pg (ref 26.0–34.0)
MCHC: 35.3 g/dL (ref 30.0–36.0)
MCV: 89.7 fL (ref 78.0–100.0)
Platelets: 124 10*3/uL — ABNORMAL LOW (ref 150–400)
RBC: 4.93 MIL/uL (ref 4.22–5.81)
RDW: 12.7 % (ref 11.5–15.5)
WBC: 4.4 10*3/uL (ref 4.0–10.5)

## 2016-05-10 LAB — LIPASE, BLOOD: Lipase: 27 U/L (ref 11–51)

## 2016-05-10 MED ORDER — ONDANSETRON HCL 4 MG PO TABS: 4.0000 mg | ORAL_TABLET | Freq: Three times a day (TID) | ORAL | 0 refills | Status: DC | PRN

## 2016-05-10 NOTE — ED Notes (Signed)
Pt ambulatory and independent at discharge.  Verbalized understanding of discharge instructions 

## 2016-05-10 NOTE — ED Triage Notes (Signed)
Pt complains of emesis and diarrhea since 5AM this morning. Pt states he then had mid abdominal pain.

## 2016-05-10 NOTE — ED Provider Notes (Signed)
[facility]-EMERGENCY DEPT Provider Note   CSN: [id number] Arrival date & time: 05/10/16  1326     History   Chief Complaint Chief Complaint  Patient presents with  . Diarrhea  . Emesis  . Abdominal Pain    HPI Brendan Hodges is a 36 y.o. male.  HPI  Approximately 12 hours of lower abdominal pain with diarrhea one episode of vomiting. No blood in vomiting or diarrhea. No fevers or rash. No sick contacts recently. No suspicious food intake. No other associated or modifying factors. Has not tried nothing for symptoms.  Past Medical History:  Diagnosis Date  . Hypertension     There are no active problems to display for this patient.   History reviewed. No pertinent surgical history.     Home Medications    Prior to Admission medications   Medication Sig Start Date End Date Taking? Authorizing Provider  acetaminophen (TYLENOL) 500 MG tablet Take 1,000 mg by mouth every 6 (six) hours as needed (pain).   Yes Historical Provider, MD  aspirin-sod bicarb-citric acid (ALKA-SELTZER) 325 MG TBEF tablet Take 325 mg by mouth every 6 (six) hours as needed (allergies).   Yes Historical Provider, MD  ondansetron (ZOFRAN) 4 MG tablet Take 1 tablet (4 mg total) by mouth every 8 (eight) hours as needed for nausea or vomiting. 05/10/16   Marily Memos, MD    Family History No family history on file.  Social History Social History  Substance Use Topics  . Smoking status: Current Every Day Smoker  . Smokeless tobacco: Never Used  . Alcohol use Yes     Allergies   Patient has no known allergies.   Review of Systems Review of Systems  All other systems reviewed and are negative.    Physical Exam Updated Vital Signs BP (!) 152/97 (BP Location: Right Arm)   Pulse 81   Temp 99 F (37.2 C) (Oral)   Resp 18   Ht  (1.803 m)   Wt 261 lb (118.4 kg)   SpO2 99%   BMI 36.40 kg/m   Physical Exam  Constitutional: He is oriented to person, place, and time. He appears  well-developed and well-nourished.  HENT:  Head: Normocephalic and atraumatic.  Eyes: Conjunctivae and EOM are normal.  Neck: Normal range of motion.  Cardiovascular: Normal rate.   Pulmonary/Chest: Effort normal. No respiratory distress.  Abdominal: Soft. He exhibits no distension. There is no tenderness.  Musculoskeletal: Normal range of motion. He exhibits no edema or deformity.  Neurological: He is alert and oriented to person, place, and time. No cranial nerve deficit. Coordination normal.  Skin: Skin is warm and dry.  Nursing note and vitals reviewed.    ED Treatments / Results  Labs (all labs ordered are listed, but only abnormal results are displayed) Labs Reviewed  COMPREHENSIVE METABOLIC PANEL - Abnormal; Notable for the following:       Result Value   Glucose, Bld 118 (*)    Calcium 8.8 (*)    All other components within normal limits  CBC - Abnormal; Notable for the following:    Platelets 124 (*)    All other components within normal limits  LIPASE, BLOOD    EKG  EKG Interpretation None       Radiology No results found.  Procedures Procedures (including critical care time)  Medications Ordered in ED Medications - No data to display   Initial Impression / Assessment and Plan / ED Course  I have reviewed the  triage vital signs and the nursing notes.  Pertinent labs & imaging results that were available during my care of the patient were reviewed by me and considered in my medical decision making (see chart for details).     Likely gastroenteritis. Abdomen benign no indication for imaging. Tolerating PO. Work note provided.  Final Clinical Impressions(s) / ED Diagnoses   Final diagnoses:  Diarrhea, unspecified type  Vomiting, intractability of vomiting not specified, presence of nausea not specified, unspecified vomiting type    New Prescriptions Discharge Medication List as of 05/10/2016  5:10 PM    START taking these medications   Details    ondansetron (ZOFRAN) 4 MG tablet Take 1 tablet (4 mg total) by mouth every 8 (eight) hours as needed for nausea or vomiting., Starting Wed 05/10/2016, Print         Marily Memos, MD 05/10/16 [phone]

## 2016-10-27 ENCOUNTER — Encounter: Payer: Self-pay | Admitting: Family Medicine

## 2016-10-27 ENCOUNTER — Ambulatory Visit (INDEPENDENT_AMBULATORY_CARE_PROVIDER_SITE_OTHER): Payer: Managed Care, Other (non HMO) | Admitting: Family Medicine

## 2016-10-27 VITALS — BP 150/94 | HR 86 | Temp 98.4°F | Ht 70.5 in | Wt 243.3 lb

## 2016-10-27 DIAGNOSIS — I10 Essential (primary) hypertension: Secondary | ICD-10-CM

## 2016-10-27 DIAGNOSIS — Z7689 Persons encountering health services in other specified circumstances: Secondary | ICD-10-CM | POA: Diagnosis not present

## 2016-10-27 DIAGNOSIS — Z13220 Encounter for screening for lipoid disorders: Secondary | ICD-10-CM

## 2016-10-27 DIAGNOSIS — E119 Type 2 diabetes mellitus without complications: Secondary | ICD-10-CM | POA: Diagnosis not present

## 2016-10-27 LAB — COMPREHENSIVE METABOLIC PANEL
ALK PHOS: 62 U/L (ref 39–117)
ALT: 20 U/L (ref 0–53)
AST: 14 U/L (ref 0–37)
Albumin: 4.9 g/dL (ref 3.5–5.2)
BUN: 11 mg/dL (ref 6–23)
CALCIUM: 10 mg/dL (ref 8.4–10.5)
CO2: 27 mEq/L (ref 19–32)
Chloride: 100 mEq/L (ref 96–112)
Creatinine, Ser: 0.84 mg/dL (ref 0.40–1.50)
GFR: 134.13 mL/min (ref >60.00)
Glucose, Bld: 216 mg/dL — ABNORMAL HIGH (ref 70–99)
POTASSIUM: 3.9 meq/L (ref 3.5–5.1)
Sodium: 134 mEq/L — ABNORMAL LOW (ref 135–145)
Total Bilirubin: 0.9 mg/dL (ref 0.2–1.2)
Total Protein: 7.2 g/dL (ref 6.0–8.3)

## 2016-10-27 LAB — CBC WITH DIFFERENTIAL/PLATELET
BASOS ABS: 0 10*3/uL (ref 0.0–0.1)
Basophils Relative: 0.5 % (ref 0.0–3.0)
EOS ABS: 0.1 10*3/uL (ref 0.0–0.7)
Eosinophils Relative: 1.1 % (ref 0.0–5.0)
HCT: 48.6 % (ref 39.0–52.0)
Hemoglobin: 16.7 g/dL (ref 13.0–17.0)
LYMPHS PCT: 49.6 % — AB (ref 12.0–46.0)
Lymphs Abs: 2.8 10*3/uL (ref 0.7–4.0)
MCHC: 34.3 g/dL (ref 30.0–36.0)
MCV: 91 fl (ref 78.0–100.0)
MONO ABS: 0.4 10*3/uL (ref 0.1–1.0)
MONOS PCT: 7.9 % (ref 3.0–12.0)
NEUTROS PCT: 40.9 % — AB (ref 43.0–77.0)
Neutro Abs: 2.3 10*3/uL (ref 1.4–7.7)
PLATELETS: 156 10*3/uL (ref 150.0–400.0)
RBC: 5.34 Mil/uL (ref 4.22–5.81)
RDW: 12.7 % (ref 11.5–15.5)
WBC: 5.7 10*3/uL (ref 4.0–10.5)

## 2016-10-27 LAB — GLUCOSE, POCT (MANUAL RESULT ENTRY): POC Glucose: 200 mg/dl — AB (ref 70–99)

## 2016-10-27 LAB — LIPID PANEL
Cholesterol: 191 mg/dL (ref 0–200)
HDL: 39.6 mg/dL (ref >39.00)
LDL Cholesterol: 128 mg/dL — ABNORMAL HIGH (ref 0–99)
NonHDL: 151.83
TRIGLYCERIDES: 118 mg/dL (ref 0.0–149.0)
Total CHOL/HDL Ratio: 5
VLDL: 23.6 mg/dL (ref 0.0–40.0)

## 2016-10-27 LAB — POCT GLYCOSYLATED HEMOGLOBIN (HGB A1C): Hemoglobin A1C: 10.5

## 2016-10-27 MED ORDER — BLOOD PRESSURE CUFF MISC: 0 refills | Status: DC

## 2016-10-27 MED ORDER — BLOOD GLUC METER DISP-STRIPS DEVI: 4 refills | Status: AC

## 2016-10-27 MED ORDER — METFORMIN HCL 500 MG PO TABS: 500.0000 mg | ORAL_TABLET | Freq: Every day | ORAL | 1 refills | Status: DC

## 2016-10-27 MED ORDER — LISINOPRIL 20 MG PO TABS: 20.0000 mg | ORAL_TABLET | Freq: Every day | ORAL | 5 refills | Status: DC

## 2016-10-27 MED ORDER — BD MICROTAINER LANCETS MISC: 5 refills | Status: AC

## 2016-10-27 NOTE — Progress Notes (Signed)
Patient presents to clinic today to establish care.  SUBJECTIVE: PMH: Pt is a 36 yo AAM with pmh sig for DM and HTN. Patient unable to name where he was previously seen for health care.  Patient provides little to no detailed information about his health history.  DM type 2: -dx'd in 2011 -Was formerly on metformin 1000 mg daily. d/c'd 2/2 intolerance (diarrhea) -Patient states he checked his blood sugar one week ago it was 297 -Needs refill on strips -Has not had vision check. -Has a One Touch meter and Relion meter at home. Prefers One Touch.  HTN: -Patient unable to name previous blood pressure medicine. States "I took little blue pills". -Patient thinks it may have been lisinopril. -Does not check BP at home -[place] fast food daily -Tries to cook with Mrs. Dashand use less salt  Allergies: NKDA  Past surgical history: None  Social history: Patient is single. He has 3 daughters ages 57, 40, 2. 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 Patient states he's been at the company for over 10 years and the only way he could get a raise was by taking the new position.  Patient endorses increased stress with new position. He also notes changes in bowel habits given the significant stress. Patient endorses soft stools, but no hematochezia or melena noted. Patient endorses formally smoking cigarettes, now smoking proximal. Patient endorses EtOH use, majoring to tall boys/ night more on the weekend. Patient denies drug use.    Health Maintenance: Vision --needs to schedule Immunizations --declined flu today Colonoscopy --N/a  Family history: Mom-HTN, DM, breast cancer Dad-HTN, DM, prostate cancer MGM-HTN, HLD, dementia MGF-deceased PGM-hypertension PGF-deceased  Past Medical History:  Diagnosis Date  . Diabetes mellitus without complication (HCC)   . Hypertension     History reviewed. No pertinent surgical history.  No current  outpatient prescriptions on file prior to visit.   No current facility-administered medications on file prior to visit.     No Known Allergies  Family History  Problem Relation Age of Onset  . Breast cancer Mother   . Diabetes Mother   . Hypertension Mother   . Prostate cancer Father   . Diabetes Father   . Diabetes Brother   . Hypertension Brother   . Hypertension Maternal Grandmother   . Hypertension Paternal Grandmother     Social History   Social History  . Marital status: Single    Spouse name: N/A  . Number of children: N/A  . Years of education: N/A   Occupational History  . Not on file.   Social History Main Topics  . Smoking status: Current Every Day Smoker  . Smokeless tobacco: Never Used  . Alcohol use Yes  . Drug use: No  . Sexual activity: Not on file   Other Topics Concern  . Not on file   Social History Narrative  . No narrative on file    ROS  General: Denies fever, chills, night sweats, changes in weight, changes in appetite HEENT: Denies headaches, ear pain, changes in vision, rhinorrhea, sore throat CV: Denies CP, palpitations, SOB, orthopnea Pulm: Denies SOB, cough, wheezing GI: Denies abdominal pain, nausea, vomiting, constipation  + occasional soft stools GU: Denies dysuria, hematuria, frequency, vaginal discharge Msk: Denies muscle cramps, joint pains Neuro: Denies weakness, numbness, tingling Skin: Denies rashes, bruising Psych: Denies depression, anxiety, hallucinations   BP (!) 150/94 (BP Location: Right Arm, Patient Position: Sitting, Cuff Size: Normal)  Pulse 86   Temp 98.4 F (36.9 C) (Oral)   Ht 5' 10.5" (1.791 m)   Wt 243 lb 4.8 oz (110.4 kg)   BMI 34.42 kg/m   Physical Exam  Gen. Pleasant, well developed, well-nourished, in NAD HEENT - [place]/AT, PERRL, EOMI, conjunctive clear, no scleral icterus, no nasal drainage, pharynx without erythema or exudate. Neck: No JVD, no thyromegaly Lungs: no use of accessory muscles,  CTAB, no wheezes, rales or rhonchi Cardiovascular: RRR, No r/g/m, no peripheral edema Abdomen: BS present, soft, nontender,nondistended, no hepatosplenomegaly Musculoskeletal: No deformities, moves all four extremities, no cyanosis or clubbing, normal tone Neuro:  A&Ox3, CN II-XII intact, normal gait Skin:  Warm, dry, intact, no lesions. Tattoos noted Psych: normal affect, mood appropriate  Recent Results (from the past 2160 hour(s))  POCT glucose (manual entry)     Status: Abnormal   Collection Time: 10/27/16  1:02 PM  Result Value Ref Range   POC Glucose 200 (A) 70 - 99 mg/dl  POCT glycosylated hemoglobin (Hb A1C)     Status: None   Collection Time: 10/27/16  1:02 PM  Result Value Ref Range   Hemoglobin A1C 10.5     Assessment/Plan: Type 2 diabetes mellitus without complication, without long-term current use of insulin (HCC)  -Patient to restart metformin 500 mg daily. Will attempt in 2 weeks to increase dose. -Patient to check fingerstick blood sugar at home and keep a log. -Given handout and discussed dietary changes to be made -Rx written for testing strips and lancets - Plan: POCT glucose (manual entry), POCT glycosylated hemoglobin (Hb A1C), BD MICROTAINER LANCETS MISC, metFORMIN (GLUCOPHAGE) 500 MG tablet, Protein / Creatinine Ratio, Urine -F/U in 2 weeks  Screening for cholesterol level - Plan: Lipid panel  Essential hypertension  -Elevated in clinic 150/94 -Discussed lifestyle changes -Will restart lisinopril. - Plan: CBC with Differential/Platelet, Comprehensive metabolic panel, lisinopril (PRINIVIL,ZESTRIL) 20 MG tablet, Blood Pressure Monitoring (BLOOD PRESSURE CUFF) MISC, CBC with Differential/Platelet  Encounter to establish care -Records relief  F/U in 2 weeks for DM and HTN

## 2016-10-27 NOTE — Patient Instructions (Addendum)
Blood Glucose Monitoring, Adult Monitoring your blood sugar (glucose) helps you manage your diabetes. It also helps you and your health care provider determine how well your diabetes management plan is working. Blood glucose monitoring involves checking your blood glucose as often as directed, and keeping a record (log) of your results over time. Why should I monitor my blood glucose? Checking your blood glucose regularly can:  Help you understand how food, exercise, illnesses, and medicines affect your blood glucose.  Let you know what your blood glucose is at any time. You can quickly tell if you are having low blood glucose (hypoglycemia) or high blood glucose (hyperglycemia).  Help you and your health care provider adjust your medicines as needed.  When should I check my blood glucose? Follow instructions from your health care provider about how often to check your blood glucose. This may depend on:  The type of diabetes you have.  How well-controlled your diabetes is.  Medicines you are taking.  If you have type 1 diabetes:  Check your blood glucose at least 2 times a day.  Also check your blood glucose: ? Before every insulin injection. ? Before and after exercise. ? Between meals. ? 2 hours after a meal. ? Occasionally between 2:00 a.m. and 3:00 a.m., as directed. ? Before potentially dangerous tasks, like driving or using heavy machinery. ? At bedtime.  You may need to check your blood glucose more often, up to 6-10 times a day: ? If you use an insulin pump. ? If you need multiple daily injections (MDI). ? If your diabetes is not well-controlled. ? If you are ill. ? If you have a history of severe hypoglycemia. ? If you have a history of not knowing when your blood glucose is getting low (hypoglycemia unawareness). If you have type 2 diabetes:  If you take insulin or other diabetes medicines, check your blood glucose at least 2 times a day.  If you are on intensive  insulin therapy, check your blood glucose at least 4 times a day. Occasionally, you may also need to check between 2:00 a.m. and 3:00 a.m., as directed.  Also check your blood glucose: ? Before and after exercise. ? Before potentially dangerous tasks, like driving or using heavy machinery.  You may need to check your blood glucose more often if: ? Your medicine is being adjusted. ? Your diabetes is not well-controlled. ? You are ill. What is a blood glucose log?  A blood glucose log is a record of your blood glucose readings. It helps you and your health care provider: ? Look for patterns in your blood glucose over time. ? Adjust your diabetes management plan as needed.  Every time you check your blood glucose, write down your result and notes about things that may be affecting your blood glucose, such as your diet and exercise for the day.  Most glucose meters store a record of glucose readings in the meter. Some meters allow you to download your records to a computer. How do I check my blood glucose? Follow these steps to get accurate readings of your blood glucose: Supplies needed   Blood glucose meter.  Test strips for your meter. Each meter has its own strips. You must use the strips that come with your meter.  A needle to prick your finger (lancet). Do not use lancets more than once.  A device that holds the lancet (lancing device).  A journal or log book to write down your results. Procedure  Wash your hands with soap and water.  Prick the side of your finger (not the tip) with the lancet. Use a different finger each time.  Gently rub the finger until a small drop of blood appears.  Follow instructions that come with your meter for inserting the test strip, applying blood to the strip, and using your blood glucose meter.  Write down your result and any notes. Alternative testing sites  Some meters allow you to use areas of your body other than your finger  (alternative sites) to test your blood.  If you think you may have hypoglycemia, or if you have hypoglycemia unawareness, do not use alternative sites. Use your finger instead.  Alternative sites may not be as accurate as the fingers, because blood flow is slower in these areas. This means that the result you get may be delayed, and it may be different from the result that you would get from your finger.  The most common alternative sites are: ? Forearm. ? Thigh. ? Palm of the hand. Additional tips  Always keep your supplies with you.  If you have questions or need help, all blood glucose meters have a 24-hour "hotline" number that you can call. You may also contact your health care provider.  After you use a few boxes of test strips, adjust (calibrate) your blood glucose meter by following instructions that came with your meter. This information is not intended to replace advice given to you by your health care provider. Make sure you discuss any questions you have with your health care provider. Document Released: 01/19/2003 Document Revised: 08/06/2015 Document Reviewed: 06/28/2015 Elsevier Interactive Patient Education  2017 [employer]  DASH Eating Plan DASH stands for "Dietary Approaches to Stop Hypertension." The DASH eating plan is a healthy eating plan that has been shown to reduce high blood pressure (hypertension). It may also reduce your risk for type 2 diabetes, heart disease, and stroke. The DASH eating plan may also help with weight loss. What are tips for following this plan? General guidelines  Avoid eating more than 2,300 mg (milligrams) of salt (sodium) a day. If you have hypertension, you may need to reduce your sodium intake to 1,500 mg a day.  Limit alcohol intake to no more than 1 drink a day for nonpregnant women and 2 drinks a day for men. One drink equals 12 oz of beer, 5 oz of wine, or 1 oz of hard liquor.  Work with your health care provider to maintain a  healthy body weight or to lose weight. Ask what an ideal weight is for you.  Get at least 30 minutes of exercise that causes your heart to beat faster (aerobic exercise) most days of the week. Activities may include walking, swimming, or biking.  Work with your health care provider or diet and nutrition specialist (dietitian) to adjust your eating plan to your individual calorie needs. Reading food labels  Check food labels for the amount of sodium per serving. Choose foods with less than 5 percent of the Daily Value of sodium. Generally, foods with less than 300 mg of sodium per serving fit into this eating plan.  To find whole grains, look for the word "whole" as the first word in the ingredient list. Shopping  Buy products labeled as "low-sodium" or "no salt added."  Buy fresh foods. Avoid canned foods and premade or frozen meals. Cooking  Avoid adding salt when cooking. Use salt-free seasonings or herbs instead of table salt or sea salt.  Check with your health care provider or pharmacist before using salt substitutes.  Do not fry foods. Cook foods using healthy methods such as baking, boiling, grilling, and broiling instead.  Cook with heart-healthy oils, such as olive, canola, soybean, or sunflower oil. Meal planning   Eat a balanced diet that includes: ? 5 or more servings of fruits and vegetables each day. At each meal, try to fill half of your plate with fruits and vegetables. ? Up to 6-8 servings of whole grains each day. ? Less than 6 oz of lean meat, poultry, or fish each day. A 3-oz serving of meat is about the same size as a deck of cards. One egg equals 1 oz. ? 2 servings of low-fat dairy each day. ? A serving of nuts, seeds, or beans 5 times each week. ? Heart-healthy fats. Healthy fats called Omega-3 fatty acids are found in foods such as flaxseeds and coldwater fish, like sardines, salmon, and mackerel.  Limit how much you eat of the following: ? Canned or  prepackaged foods. ? Food that is high in trans fat, such as fried foods. ? Food that is high in saturated fat, such as fatty meat. ? Sweets, desserts, sugary drinks, and other foods with added sugar. ? Full-fat dairy products.  Do not salt foods before eating.  Try to eat at least 2 vegetarian meals each week.  Eat more home-cooked food and less restaurant, buffet, and fast food.  When eating at a restaurant, ask that your food be prepared with less salt or no salt, if possible. What foods are recommended? The items listed may not be a complete list. Talk with your dietitian about what dietary choices are best for you. Grains Whole-grain or whole-wheat bread. Whole-grain or whole-wheat pasta. Brown rice. Orpah Cobbatmeal. Quinoa. Bulgur. Whole-grain and low-sodium cereals. Pita bread. Low-fat, low-sodium crackers. Whole-wheat flour tortillas. Vegetables Fresh or frozen vegetables (raw, steamed, roasted, or grilled). Low-sodium or reduced-sodium tomato and vegetable juice. Low-sodium or reduced-sodium tomato sauce and tomato paste. Low-sodium or reduced-sodium canned vegetables. Fruits All fresh, dried, or frozen fruit. Canned fruit in natural juice (without added sugar). Meat and other protein foods Skinless chicken or [place]. Ground chicken or [place]. Pork with fat trimmed off. Fish and seafood. Egg whites. Dried beans, peas, or lentils. Unsalted nuts, nut butters, and seeds. Unsalted canned beans. Lean cuts of beef with fat trimmed off. Low-sodium, lean deli meat. Dairy Low-fat (1%) or fat-free (skim) milk. Fat-free, low-fat, or reduced-fat cheeses. Nonfat, low-sodium ricotta or cottage cheese. Low-fat or nonfat yogurt. Low-fat, low-sodium cheese. Fats and oils Soft margarine without trans fats. Vegetable oil. Low-fat, reduced-fat, or light mayonnaise and salad dressings (reduced-sodium). Canola, safflower, olive, soybean, and sunflower oils. Avocado. Seasoning and other foods Herbs. Spices.  Seasoning mixes without salt. Unsalted popcorn and pretzels. Fat-free sweets. What foods are not recommended? The items listed may not be a complete list. Talk with your dietitian about what dietary choices are best for you. Grains Baked goods made with fat, such as croissants, muffins, or some breads. Dry pasta or rice meal packs. Vegetables Creamed or fried vegetables. Vegetables in a cheese sauce. Regular canned vegetables (not low-sodium or reduced-sodium). Regular canned tomato sauce and paste (not low-sodium or reduced-sodium). Regular tomato and vegetable juice (not low-sodium or reduced-sodium). Rosita FirePickles. Olives. Fruits Canned fruit in a light or heavy syrup. Fried fruit. Fruit in cream or butter sauce. Meat and other protein foods Fatty cuts of meat. Ribs. Fried meat. Tomasa BlaseBacon. Sausage. Bologna and other  processed lunch meats. Salami. Fatback. Hotdogs. Bratwurst. Salted nuts and seeds. Canned beans with added salt. Canned or smoked fish. Whole eggs or egg yolks. Chicken or [place] with skin. Dairy Whole or 2% milk, cream, and half-and-half. Whole or full-fat cream cheese. Whole-fat or sweetened yogurt. Full-fat cheese. Nondairy creamers. Whipped toppings. Processed cheese and cheese spreads. Fats and oils Butter. Stick margarine. Lard. Shortening. Ghee. Bacon fat. Tropical oils, such as coconut, palm kernel, or palm oil. Seasoning and other foods Salted popcorn and pretzels. Onion salt, garlic salt, seasoned salt, table salt, and sea salt. Worcestershire sauce. Tartar sauce. Barbecue sauce. Teriyaki sauce. Soy sauce, including reduced-sodium. Steak sauce. Canned and packaged gravies. Fish sauce. Oyster sauce. Cocktail sauce. Horseradish that you find on the shelf. Ketchup. Mustard. Meat flavorings and tenderizers. Bouillon cubes. Hot sauce and Tabasco sauce. Premade or packaged marinades. Premade or packaged taco seasonings. Relishes. Regular salad dressings. Where to find more  information:  National Heart, Lung, and Blood Institute: PopSteam.is  American Heart Association: www.heart.org Summary  The DASH eating plan is a healthy eating plan that has been shown to reduce high blood pressure (hypertension). It may also reduce your risk for type 2 diabetes, heart disease, and stroke.  With the DASH eating plan, you should limit salt (sodium) intake to 2,300 mg a day. If you have hypertension, you may need to reduce your sodium intake to 1,500 mg a day.  When on the DASH eating plan, aim to eat more fresh fruits and vegetables, whole grains, lean proteins, low-fat dairy, and heart-healthy fats.  Work with your health care provider or diet and nutrition specialist (dietitian) to adjust your eating plan to your individual calorie needs. This information is not intended to replace advice given to you by your health care provider. Make sure you discuss any questions you have with your health care provider. Document Released: 01/05/2011 Document Revised: 01/10/2016 Document Reviewed: 01/10/2016 Elsevier Interactive Patient Education  2017 Else[employer] Inc.  Diabetes Mellitus and Exercise Exercising regularly is important for your overall health, especially when you have diabetes (diabetes mellitus). Exercising is not only about losing weight. It has many health benefits, such as increasing muscle strength and bone density and reducing body fat and stress. This leads to improved fitness, flexibility, and endurance, all of which result in better overall health. Exercise has additional benefits for people with diabetes, including:  Reducing appetite.  Helping to lower and control blood glucose.  Lowering blood pressure.  Helping to control amounts of fatty substances (lipids) in the blood, such as cholesterol and triglycerides.  Helping the body to respond better to insulin (improving insulin sensitivity).  Reducing how much insulin the body needs.  Decreasing the  risk for heart disease by: ? Lowering cholesterol and triglyceride levels. ? Increasing the levels of good cholesterol. ? Lowering blood glucose levels.  What is my activity plan? Your health care provider or certified diabetes educator can help you make a plan for the type and frequency of exercise (activity plan) that works for you. Make sure that you:  Do at least 150 minutes of moderate-intensity or vigorous-intensity exercise each week. This could be brisk walking, biking, or water aerobics. ? Do stretching and strength exercises, such as yoga or weightlifting, at least 2 times a week. ? Spread out your activity over at least 3 days of the week.  Get some form of physical activity every day. ? Do not go more than 2 days in a row without some kind of physical  activity. ? Avoid being inactive for more than 90 minutes at a time. Take frequent breaks to walk or stretch.  Choose a type of exercise or activity that you enjoy, and set realistic goals.  Start slowly, and gradually increase the intensity of your exercise over time.  What do I need to know about managing my diabetes?  Check your blood glucose before and after exercising. ? If your blood glucose is higher than 240 mg/dL (16.1 mmol/L) before you exercise, check your urine for ketones. If you have ketones in your urine, do not exercise until your blood glucose returns to normal.  Know the symptoms of low blood glucose (hypoglycemia) and how to treat it. Your risk for hypoglycemia increases during and after exercise. Common symptoms of hypoglycemia can include: ? Hunger. ? Anxiety. ? Sweating and feeling clammy. ? Confusion. ? Dizziness or feeling light-headed. ? Increased heart rate or palpitations. ? Blurry vision. ? Tingling or numbness around the mouth, lips, or tongue. ? Tremors or shakes. ? Irritability.  Keep a rapid-acting carbohydrate snack available before, during, and after exercise to help prevent or treat  hypoglycemia.  Avoid injecting insulin into areas of the body that are going to be exercised. For example, avoid injecting insulin into: ? The arms, when playing tennis. ? The legs, when jogging.  Keep records of your exercise habits. Doing this can help you and your health care provider adjust your diabetes management plan as needed. Write down: ? Food that you eat before and after you exercise. ? Blood glucose levels before and after you exercise. ? The type and amount of exercise you have done. ? When your insulin is expected to peak, if you use insulin. Avoid exercising at times when your insulin is peaking.  When you start a new exercise or activity, work with your health care provider to make sure the activity is safe for you, and to adjust your insulin, medicines, or food intake as needed.  Drink plenty of water while you exercise to prevent dehydration or heat stroke. Drink enough fluid to keep your urine clear or pale yellow. This information is not intended to replace advice given to you by your health care provider. Make sure you discuss any questions you have with your health care provider. Document Released: 04/08/2003 Document Revised: 08/06/2015 Document Reviewed: 06/28/2015 Elsevier Interactive Patient Education  2018 [employer]. Daily Diabetes Record Introduction Check your blood glucose (BG) as directed by your health care provider. Use this form to record your BG results as well as any diabetes medicines that you take, including insulin. Bringing a record of your BG results and a list of your current medicines to your health care provider is very helpful in managing your diabetes. These numbers help your health care provider to know whether your diabetes management plan needs to be changed. Patient name: ____________________________________ Week of ____________________ Daily BG results and diabetes medicines Date: _________  Breakfast - BG / Medicines: ________________  / __________________________________________________________  Lunch - BG / Medicines: ___________________ / __________________________________________________________  Dinner - BG / Medicines: __________________ / __________________________________________________________  Bedtime - BG / Medicines: ________________ / ___________________________________________________________  Date: _________  Breakfast - BG / Medicines: ________________ / __________________________________________________________  Lunch - BG / Medicines: ___________________ / __________________________________________________________  Dinner - BG / Medicines: __________________ / __________________________________________________________  Bedtime - BG / Medicines: ________________ / ___________________________________________________________  Date: _________  Breakfast - BG / Medicines: ________________ / __________________________________________________________  Lunch - BG / Medicines: ___________________ / __________________________________________________________  Dinner -  BG / Medicines: __________________ / __________________________________________________________  Bedtime - BG / Medicines: ________________ / ___________________________________________________________  Date: _________  Breakfast - BG / Medicines: ________________ / __________________________________________________________  Lunch - BG / Medicines: ___________________ / __________________________________________________________  Dinner - BG / Medicines: __________________ / __________________________________________________________  Bedtime - BG / Medicines: ________________ / ___________________________________________________________  Date: _________  Breakfast - BG / Medicines: ________________ / __________________________________________________________  Lunch - BG / Medicines: ___________________ /  __________________________________________________________  Dinner - BG / Medicines: __________________ / __________________________________________________________  Bedtime - BG / Medicines: ________________ / ___________________________________________________________  Date: _________  Breakfast - BG / Medicines: ________________ / __________________________________________________________  Lunch - BG / Medicines: ___________________ / __________________________________________________________  Dinner - BG / Medicines: __________________ / __________________________________________________________  Bedtime - BG / Medicines: ________________ / ___________________________________________________________  Date: _________  Breakfast - BG / Medicines: ________________ / __________________________________________________________  Lunch - BG / Medicines: ___________________ / __________________________________________________________  Dinner - BG / Medicines: __________________ / __________________________________________________________  Bedtime - BG / Medicines: ________________ / ___________________________________________________________  Notes: ______________________________________________________________________________________________________________________ This information is not intended to replace advice given to you by your health care provider. Make sure you discuss any questions you have with your health care provider. Document Released: 12/21/2003 Document Revised: 10/15/2015 Document Reviewed: 10/15/2015 Elsevier Interactive Patient Education  2018 [employer].  Hypertension Hypertension is another name for high blood pressure. High blood pressure forces your heart to work harder to pump blood. This can cause problems over time. There are two numbers in a blood pressure reading. There is a top number (systolic) over a bottom number (diastolic). It is best to have a  blood pressure below 120/80. Healthy choices can help lower your blood pressure. You may need medicine to help lower your blood pressure if:  Your blood pressure cannot be lowered with healthy choices.  Your blood pressure is higher than 130/80.  Follow these instructions at home: Eating and drinking  If directed, follow the DASH eating plan. This diet includes: ? Filling half of your plate at each meal with fruits and vegetables. ? Filling one quarter of your plate at each meal with whole grains. Whole grains include whole wheat pasta, brown rice, and whole grain bread. ? Eating or drinking low-fat dairy products, such as skim milk or low-fat yogurt. ? Filling one quarter of your plate at each meal with low-fat (lean) proteins. Low-fat proteins include fish, skinless chicken, eggs, beans, and tofu. ? Avoiding fatty meat, cured and processed meat, or chicken with skin. ? Avoiding premade or processed food.  Eat less than 1,500 mg of salt (sodium) a day.  Limit alcohol use to no more than 1 drink a day for nonpregnant women and 2 drinks a day for men. One drink equals 12 oz of beer, 5 oz of wine, or 1 oz of hard liquor. Lifestyle  Work with your doctor to stay at a healthy weight or to lose weight. Ask your doctor what the best weight is for you.  Get at least 30 minutes of exercise that causes your heart to beat faster (aerobic exercise) most days of the week. This may include walking, swimming, or biking.  Get at least 30 minutes of exercise that strengthens your muscles (resistance exercise) at least 3 days a week. This may include lifting weights or pilates.  Do not use any products that contain nicotine or tobacco. This includes cigarettes and e-cigarettes. If you need help quitting, ask your doctor.  Check your blood pressure at home as told by your doctor.  Keep all follow-up visits as told by your doctor. This is important. Medicines  Take over-the-counter and prescription  medicines only as told by your doctor. Follow directions carefully.  Do not skip doses of blood pressure medicine. The medicine does not work as well if you skip doses. Skipping doses also puts you at risk for problems.  Ask your doctor about side effects or reactions to medicines that you should watch for. Contact a doctor if:  You think you are having a reaction to the medicine you are taking.  You have headaches that keep coming back (recurring).  You feel dizzy.  You have swelling in your ankles.  You have trouble with your vision. Get help right away if:  You get a very bad headache.  You start to feel confused.  You feel weak or numb.  You feel faint.  You get very bad pain in your: ? Chest. ? Belly (abdomen).  You throw up (vomit) more than once.  You have trouble breathing. Summary  Hypertension is another name for high blood pressure.  Making healthy choices can help lower blood pressure. If your blood pressure cannot be controlled with healthy choices, you may need to take medicine. This information is not intended to replace advice given to you by your health care provider. Make sure you discuss any questions you have with your health care provider. Document Released: 07/05/2007 Document Revised: 12/15/2015 Document Reviewed: 12/15/2015 Elsevier Interactive Patient Education  [employer].

## 2016-10-28 LAB — PROTEIN / CREATININE RATIO, URINE
Creatinine, Urine: 239 mg/dL (ref 20–320)
Protein/Creat Ratio: 113 mg/g creat (ref 22–128)
TOTAL PROTEIN, URINE: 27 mg/dL — AB (ref 5–25)

## 2016-10-28 LAB — EXTRA URINE SPECIMEN

## 2016-10-31 ENCOUNTER — Telehealth: Payer: Self-pay | Admitting: Family Medicine

## 2016-10-31 NOTE — Telephone Encounter (Signed)
Brendan Hodges pt returned your call °

## 2016-11-01 NOTE — Telephone Encounter (Signed)
Spoke with patient regarding lab results. Patient understood nothing further needed

## 2016-11-10 ENCOUNTER — Ambulatory Visit (INDEPENDENT_AMBULATORY_CARE_PROVIDER_SITE_OTHER): Payer: Managed Care, Other (non HMO) | Admitting: Family Medicine

## 2016-11-10 ENCOUNTER — Encounter: Payer: Self-pay | Admitting: Family Medicine

## 2016-11-10 DIAGNOSIS — E119 Type 2 diabetes mellitus without complications: Secondary | ICD-10-CM | POA: Diagnosis not present

## 2016-11-10 LAB — GLUCOSE, POCT (MANUAL RESULT ENTRY): POC Glucose: 126 mg/dl — AB (ref 70–99)

## 2016-11-10 NOTE — Patient Instructions (Addendum)
Check out www.diabetes.org for meal and snack ideas.    Diabetes Mellitus and Exercise Exercising regularly is important for your overall health, especially when you have diabetes (diabetes mellitus). Exercising is not only about losing weight. It has many health benefits, such as increasing muscle strength and bone density and reducing body fat and stress. This leads to improved fitness, flexibility, and endurance, all of which result in better overall health. Exercise has additional benefits for people with diabetes, including:  Reducing appetite.  Helping to lower and control blood glucose.  Lowering blood pressure.  Helping to control amounts of fatty substances (lipids) in the blood, such as cholesterol and triglycerides.  Helping the body to respond better to insulin (improving insulin sensitivity).  Reducing how much insulin the body needs.  Decreasing the risk for heart disease by: ? Lowering cholesterol and triglyceride levels. ? Increasing the levels of good cholesterol. ? Lowering blood glucose levels.  What is my activity plan? Your health care provider or certified diabetes educator can help you make a plan for the type and frequency of exercise (activity plan) that works for you. Make sure that you:  Do at least 150 minutes of moderate-intensity or vigorous-intensity exercise each week. This could be brisk walking, biking, or water aerobics. ? Do stretching and strength exercises, such as yoga or weightlifting, at least 2 times a week. ? Spread out your activity over at least 3 days of the week.  Get some form of physical activity every day. ? Do not go more than 2 days in a row without some kind of physical activity. ? Avoid being inactive for more than 90 minutes at a time. Take frequent breaks to walk or stretch.  Choose a type of exercise or activity that you enjoy, and set realistic goals.  Start slowly, and gradually increase the intensity of your exercise  over time.  What do I need to know about managing my diabetes?  Check your blood glucose before and after exercising. ? If your blood glucose is higher than 240 mg/dL (16.1 mmol/L) before you exercise, check your urine for ketones. If you have ketones in your urine, do not exercise until your blood glucose returns to normal.  Know the symptoms of low blood glucose (hypoglycemia) and how to treat it. Your risk for hypoglycemia increases during and after exercise. Common symptoms of hypoglycemia can include: ? Hunger. ? Anxiety. ? Sweating and feeling clammy. ? Confusion. ? Dizziness or feeling light-headed. ? Increased heart rate or palpitations. ? Blurry vision. ? Tingling or numbness around the mouth, lips, or tongue. ? Tremors or shakes. ? Irritability.  Keep a rapid-acting carbohydrate snack available before, during, and after exercise to help prevent or treat hypoglycemia.  Avoid injecting insulin into areas of the body that are going to be exercised. For example, avoid injecting insulin into: ? The arms, when playing tennis. ? The legs, when jogging.  Keep records of your exercise habits. Doing this can help you and your health care provider adjust your diabetes management plan as needed. Write down: ? Food that you eat before and after you exercise. ? Blood glucose levels before and after you exercise. ? The type and amount of exercise you have done. ? When your insulin is expected to peak, if you use insulin. Avoid exercising at times when your insulin is peaking.  When you start a new exercise or activity, work with your health care provider to make sure the activity is safe for you, and  to adjust your insulin, medicines, or food intake as needed.  Drink plenty of water while you exercise to prevent dehydration or heat stroke. Drink enough fluid to keep your urine clear or pale yellow. This information is not intended to replace advice given to you by your health care  provider. Make sure you discuss any questions you have with your health care provider. Document Released: 04/08/2003 Document Revised: 08/06/2015 Document Reviewed: 06/28/2015 Elsevier Interactive Patient Education  2018 [employer].  Tips for Eating Away From Home If You Have Diabetes Controlling your level of blood glucose, also known as blood sugar, can be challenging. It can be even more difficult when you do not prepare your own meals. The following tips can help you manage your diabetes when you eat away from home. Planning ahead Plan ahead if you know you will be eating away from home:  Ask your health care provider how to time meals and medicine if you are taking insulin.  Make a list of restaurants near you that offer healthy choices. If they have a carry-out menu, take it home and plan what you will order ahead of time.  Look up the restaurant you want to eat at online. Many chain and fast-food restaurants list nutritional information online. Use this information to choose the healthiest options and to calculate how many carbohydrates will be in your meal.  Use a carbohydrate-counting book or mobile app to look up the carbohydrate content and serving size of the foods you want to eat.  Become familiar with serving sizes and learn to recognize how many servings are in a portion. This will allow you to estimate how many carbohydrates you can eat.  Free foods A "free food" is any food or drink that has less than 5 g of carbohydrates per serving. Free foods include:  Many vegetables.  Hard boiled eggs.  Nuts or seeds.  Olives.  Cheeses.  Meats.  These types of foods make good appetizer choices and are often available at salad bars. Lemon juice, vinegar, or a low-calorie salad dressing of fewer than 20 calories per serving can be used as a "free" salad dressing. Choices to reduce carbohydrates  Substitute nonfat sweetened yogurt with a sugar-free yogurt. Yogurt made from  soy milk may also be used, but you will still want a sugar-free or plain option to choose a lower carbohydrate amount.  Ask your server to take away the bread basket or chips from your table.  Order fresh fruit. A salad bar often offers fresh fruit choices. Avoid canned fruit because it is usually packed in sugar or syrup.  Order a salad, and eat it without dressing. Or, create a "free" salad dressing.  Ask for substitutions. For example, instead of [place] fries, request an order of a vegetable such as salad, green beans, or broccoli. Other tips  If you take insulin, take the insulin once your food arrives to your table. This will ensure your insulin and food are timed correctly.  Ask your server about the portion size before your order, and ask for a take-out box if the portion has more servings than you should have. When your food comes, leave the amount you should have on the plate, and put the rest in the take-out box.  Consider splitting an entree with someone and ordering a side salad. This information is not intended to replace advice given to you by your health care provider. Make sure you discuss any questions you have with your health care  provider. Document Released: 01/16/2005 Document Revised: 06/24/2015 Document Reviewed: 04/15/2013 Elsevier Interactive Patient Education  [employer].

## 2016-11-10 NOTE — Progress Notes (Signed)
Subjective:     Brendan Hodges is an 36 y.o. male who presents for follow up of diabetes. Current symptoms include: none. Patient denies hyperglycemia, increased appetite, nausea, polydipsia and polyuria. Evaluation to date has included: fasting blood sugar and hemoglobin A1C. Home sugars: BGs consistently in an acceptable range. Current treatments: more intensive attention to diet which has been effective, weight loss of 5 lbs which has been effective and Continued metformin which has been effective. Dilated eye exam needs to be done.  Pt has been eating better.  He is eating more salads, not eating much fast food, and drinking only water.  Pt does endorse some loose stools.  He also had a burger but instead of eating the bread it was wrapped in lettuce. Pt has lost 5 lbs since last visit.  The following portions of the patient's history were reviewed and updated as appropriate: allergies, current medications, past family history, past medical history, past social history, past surgical history and problem list.  Review of Systems A comprehensive review of systems was negative.    Objective:    BP 132/90 (BP Location: Right Arm, Patient Position: Sitting, Cuff Size: Normal)   Pulse 67   Temp 98.3 F (36.8 C) (Oral)   Wt 238 lb 9.6 oz (108.2 kg)   BMI 33.75 kg/m   General Appearance:    Alert, cooperative, no distress, appears stated age  Head:    Normocephalic, without obvious abnormality, atraumatic  Eyes:    PERRL, no scleral icterus both eyes       Nose:   Nares normal, septum midline, mucosa normal, no drainage    or sinus tenderness  Throat:   Lips, mucosa, and tongue normal; teeth and gums normal  Neck:   Supple, symmetrical, trachea midline, no adenopathy  Back:     Symmetric, no curvature, ROM normal, no CVA tenderness  Lungs:     Clear to auscultation bilaterally, respirations unlabored  Heart:    Regular rate and rhythm, S1 and S2 normal, no murmur, rub   or gallop  Abdomen:      Soft, non-tender, bowel sounds present,    no masses, no organomegaly  Extremities:   Extremities normal, atraumatic, no cyanosis or edema  Pulses:   2+ and symmetric all extremities  Skin:   Skin color, texture, turgor normal, no rashes or lesions  Neurologic:   CNII-XII intact. Normal strength, sensation and reflexes      throughout    Laboratory: No components found for: A1C   Hgb A1C 10.5% on 10/27/16   Assessment:    Diabetes mellitus Type II, improving.  Pt congratulated on taking steps to improve health.   Plan:    Discussed general issues about diabetes pathophysiology and management. Addressed ADA diet. Encouraged aerobic exercise. Given handouts on food choices while eating out, healthy recipes, etc. FSBS 126 Will recheck Hgb A1C in ~2 months.   Discussed will need to increase Metformin at next [facility].  Waiting until then 2/2 some loose stools. Follow up in 1 month or as needed.

## 2016-12-08 ENCOUNTER — Encounter: Payer: Self-pay | Admitting: Family Medicine

## 2016-12-08 ENCOUNTER — Ambulatory Visit: Payer: Managed Care, Other (non HMO) | Admitting: Family Medicine

## 2016-12-08 VITALS — BP 130/100 | HR 86 | Temp 98.3°F | Wt 248.2 lb

## 2016-12-08 DIAGNOSIS — E119 Type 2 diabetes mellitus without complications: Secondary | ICD-10-CM | POA: Diagnosis not present

## 2016-12-08 DIAGNOSIS — I10 Essential (primary) hypertension: Secondary | ICD-10-CM

## 2016-12-08 MED ORDER — ONETOUCH VERIO VI STRP: ORAL_STRIP | 11 refills | Status: DC

## 2016-12-08 MED ORDER — METFORMIN HCL ER 750 MG PO TB24: 750.0000 mg | ORAL_TABLET | Freq: Every day | ORAL | 3 refills | Status: DC

## 2016-12-08 NOTE — Patient Instructions (Signed)
You can check the price to the test strips at [facility] [address] or at [facility] to see if they are cheaper.  Apparently you do not have to have a [facility] card to use their pharmacy.  I have changed your Metformin to the extended release form and increased the dose slightly.  Please do not forget to start checking her blood pressure daily and keep a record of it.  Continue to check your blood sugar at home and keep a log of it.

## 2016-12-08 NOTE — Progress Notes (Signed)
Subjective:    Patient ID: Brendan Hodges, male    DOB: 11/04/1980, 36 y.o.   MRN: [id number]  Chief Complaint  Patient presents with  . Follow-up    HPI Patient was seen today for f/u on htn and dm.  HTN: -On lisinopril 20 mg daily, however patient forgets to take at least 3 times a week -Patient does not check BP at home. -Patient is trying to eat better.  DM II: -on metformin 500 mg daily -hesitant to increase 2/2 loose stools -Was taking BS 3 times daily, but secondary to cost of test strips may check daily.  Strips were $40-50. -Trying to eat better, cooking more at home, increasing water intake. -Thinks FSBS will be elevated 2/2 drinking alcohol last night. -Patient denies hypo-or hyperglycemia.   Past Medical History:  Diagnosis Date  . Diabetes mellitus without complication (HCC)   . Hypertension     No Known Allergies  ROS General: Denies fever, chills, night sweats, changes in weight, changes in appetite HEENT: Denies headaches, ear pain, changes in vision, rhinorrhea, sore throat CV: Denies CP, palpitations, SOB, orthopnea Pulm: Denies SOB, cough, wheezing GI: Denies abdominal pain, nausea, vomiting, diarrhea, constipation GU: Denies dysuria, hematuria, frequency, vaginal discharge Msk: Denies muscle cramps, joint pains Neuro: Denies weakness, numbness, tingling Skin: Denies rashes, bruising Psych: Denies depression, anxiety, hallucinations     Objective:    Blood pressure (!) 130/100, pulse 86, temperature 98.3 F (36.8 C), temperature source Oral, weight 248 lb 3.2 oz (112.6 kg).   Gen. Pleasant, well-nourished, in no distress, normal affect   HEENT: [place]/AT, face symmetric, no scleral icterus, PERRLA, nares patent without drainage, pharynx without erythema or exudate. Neck: No JVD, no thyromegaly, no carotid bruits Lungs: no accessory muscle use, CTAB, no wheezes or rales Cardiovascular: RRR, no m/r/g, no peripheral edema Neuro:  A&Ox3, CN II-XII  intact, normal gait   Wt Readings from Last 3 Encounters:  12/08/16 248 lb 3.2 oz (112.6 kg)  11/10/16 238 lb 9.6 oz (108.2 kg)  10/27/16 243 lb 4.8 oz (110.4 kg)    Lab Results  Component Value Date   WBC 5.7 10/27/2016   HGB 16.7 10/27/2016   HCT 48.6 10/27/2016   PLT 156.0 10/27/2016   GLUCOSE 216 (H) 10/27/2016   CHOL 191 10/27/2016   TRIG 118.0 10/27/2016   HDL 39.60 10/27/2016   LDLCALC 128 (H) 10/27/2016   ALT 20 10/27/2016   AST 14 10/27/2016   NA 134 (L) 10/27/2016   K 3.9 10/27/2016   CL 100 10/27/2016   CREATININE 0.84 10/27/2016   BUN 11 10/27/2016   CO2 27 10/27/2016   HGBA1C 10.5 10/27/2016    Assessment/Plan:  Controlled type 2 diabetes mellitus without complication, without long-term current use of insulin (HCC) -Encouraged to continue lifestyle modifications -Last Hgb A1c 10.5 in September -We will start metformin extended release 750 mg daily in hopes to reduce GI symptoms -Given Rx for test strips.  Advised to call different pharmacies to inquire about pricing. -Continue FSBS at home and keep a log. -Follow-up in 1 month sooner if needed  Essential hypertension -Uncontrolled -Dose not adjusted as patient did not take medication this morning. -Continue lisinopril 20 mg daily -Discussed need for BP cuff at home to monitor BP.  Pt instructed to call clinic if BP is elevated at home after consistent BP medication use. -Patient to keep a log of blood pressure and bring to each visit. -Encouraged to increase physical activity -Follow-up in  1 month.

## 2017-01-12 ENCOUNTER — Ambulatory Visit: Payer: Managed Care, Other (non HMO) | Admitting: Family Medicine

## 2017-02-16 ENCOUNTER — Ambulatory Visit: Payer: Managed Care, Other (non HMO) | Admitting: Family Medicine

## 2017-04-13 ENCOUNTER — Ambulatory Visit: Payer: Managed Care, Other (non HMO) | Admitting: Family Medicine

## 2017-04-13 ENCOUNTER — Encounter: Payer: Self-pay | Admitting: Family Medicine

## 2017-04-13 VITALS — BP 138/96 | HR 80 | Temp 97.7°F | Wt 246.0 lb

## 2017-04-13 DIAGNOSIS — I10 Essential (primary) hypertension: Secondary | ICD-10-CM

## 2017-04-13 DIAGNOSIS — E119 Type 2 diabetes mellitus without complications: Secondary | ICD-10-CM

## 2017-04-13 NOTE — Patient Instructions (Addendum)
Diabetes Mellitus and Nutrition When you have diabetes (diabetes mellitus), it is very important to have healthy eating habits because your blood sugar (glucose) levels are greatly affected by what you eat and drink. Eating healthy foods in the appropriate amounts, at about the same times every day, can help you:  Control your blood glucose.  Lower your risk of heart disease.  Improve your blood pressure.  Reach or maintain a healthy weight.  Every person with diabetes is different, and each person has different needs for a meal plan. Your health care provider may recommend that you work with a diet and nutrition specialist (dietitian) to make a meal plan that is best for you. Your meal plan may vary depending on factors such as:  The calories you need.  The medicines you take.  Your weight.  Your blood glucose, blood pressure, and cholesterol levels.  Your activity level.  Other health conditions you have, such as heart or kidney disease.  How do carbohydrates affect me? Carbohydrates affect your blood glucose level more than any other type of food. Eating carbohydrates naturally increases the amount of glucose in your blood. Carbohydrate counting is a method for keeping track of how many carbohydrates you eat. Counting carbohydrates is important to keep your blood glucose at a healthy level, especially if you use insulin or take certain oral diabetes medicines. It is important to know how many carbohydrates you can safely have in each meal. This is different for every person. Your dietitian can help you calculate how many carbohydrates you should have at each meal and for snack. Foods that contain carbohydrates include:  Bread, cereal, rice, pasta, and crackers.  Potatoes and corn.  Peas, beans, and lentils.  Milk and yogurt.  Fruit and juice.  Desserts, such as cakes, cookies, ice cream, and candy.  How does alcohol affect me? Alcohol can cause a sudden decrease in blood  glucose (hypoglycemia), especially if you use insulin or take certain oral diabetes medicines. Hypoglycemia can be a life-threatening condition. Symptoms of hypoglycemia (sleepiness, dizziness, and confusion) are similar to symptoms of having too much alcohol. If your health care provider says that alcohol is safe for you, follow these guidelines:  Limit alcohol intake to no more than 1 drink per day for nonpregnant women and 2 drinks per day for men. One drink equals 12 oz of beer, 5 oz of wine, or 1 oz of hard liquor.  Do not drink on an empty stomach.  Keep yourself hydrated with water, diet soda, or unsweetened iced tea.  Keep in mind that regular soda, juice, and other mixers may contain a lot of sugar and must be counted as carbohydrates.  What are tips for following this plan? Reading food labels  Start by checking the serving size on the label. The amount of calories, carbohydrates, fats, and other nutrients listed on the label are based on one serving of the food. Many foods contain more than one serving per package.  Check the total grams (g) of carbohydrates in one serving. You can calculate the number of servings of carbohydrates in one serving by dividing the total carbohydrates by 15. For example, if a food has 30 g of total carbohydrates, it would be equal to 2 servings of carbohydrates.  Check the number of grams (g) of saturated and trans fats in one serving. Choose foods that have low or no amount of these fats.  Check the number of milligrams (mg) of sodium in one serving. Most people   should limit total sodium intake to less than 2,300 mg per day.  Always check the nutrition information of foods labeled as "low-fat" or "nonfat". These foods may be higher in added sugar or refined carbohydrates and should be avoided.  Talk to your dietitian to identify your daily goals for nutrients listed on the label. Shopping  Avoid buying canned, premade, or processed foods. These  foods tend to be high in fat, sodium, and added sugar.  Shop around the outside edge of the grocery store. This includes fresh fruits and vegetables, bulk grains, fresh meats, and fresh dairy. Cooking  Use low-heat cooking methods, such as baking, instead of high-heat cooking methods like deep frying.  Cook using healthy oils, such as olive, canola, or sunflower oil.  Avoid cooking with butter, cream, or high-fat meats. Meal planning  Eat meals and snacks regularly, preferably at the same times every day. Avoid going long periods of time without eating.  Eat foods high in fiber, such as fresh fruits, vegetables, beans, and whole grains. Talk to your dietitian about how many servings of carbohydrates you can eat at each meal.  Eat 4-6 ounces of lean protein each day, such as lean meat, chicken, fish, eggs, or tofu. 1 ounce is equal to 1 ounce of meat, chicken, or fish, 1 egg, or 1/4 cup of tofu.  Eat some foods each day that contain healthy fats, such as avocado, nuts, seeds, and fish. Lifestyle   Check your blood glucose regularly.  Exercise at least 30 minutes 5 or more days each week, or as told by your health care provider.  Take medicines as told by your health care provider.  Do not use any products that contain nicotine or tobacco, such as cigarettes and e-cigarettes. If you need help quitting, ask your health care provider.  Work with a counselor or diabetes educator to identify strategies to manage stress and any emotional and social challenges. What are some questions to ask my health care provider?  Do I need to meet with a diabetes educator?  Do I need to meet with a dietitian?  What number can I call if I have questions?  When are the best times to check my blood glucose? Where to find more information:  American Diabetes Association: diabetes.org/food-and-fitness/food  Academy of Nutrition and Dietetics:  www.eatright.org/resources/health/diseases-and-conditions/diabetes  National Institute of Diabetes and Digestive and Kidney Diseases (NIH): www.niddk.nih.gov/health-information/diabetes/overview/diet-eating-physical-activity Summary  A healthy meal plan will help you control your blood glucose and maintain a healthy lifestyle.  Working with a diet and nutrition specialist (dietitian) can help you make a meal plan that is best for you.  Keep in mind that carbohydrates and alcohol have immediate effects on your blood glucose levels. It is important to count carbohydrates and to use alcohol carefully. This information is not intended to replace advice given to you by your health care provider. Make sure you discuss any questions you have with your health care provider. Document Released: 10/13/2004 Document Revised: 02/21/2016 Document Reviewed: 02/21/2016 Elsevier Interactive Patient Education  2018 [employer]  

## 2017-04-13 NOTE — Progress Notes (Signed)
Subjective:    Patient ID: Brendan Hodges, male    DOB: 05/08/1980, 37 y.o.   MRN: [id number]  No chief complaint on file.   HPI Patient was seen today for f/u.  Pt last seen in 12/08/16.  Pt endorses continued non-compliance.  Metformin was changed to ER at last [facility] for GI symptoms.  Pt states he often forgets to take Metformin and lisinopril.  Still having some "mushy" stools.  Pt has not been checking his bp at home.  Pt states he has joined a gym, but has not started going yet.  Pt also states he has been cooking at home, but has been eating fast food recently b/c of extended hours at work.  Pt also endorses drinking more beer than he probably should on Friday evenings.  Pt was checking fsbs at home, but states his meter recently broke.  Pt did endorse a few instances of increased urination.   Past Medical History:  Diagnosis Date  . Diabetes mellitus without complication (HCC)   . Hypertension     No Known Allergies  ROS General: Denies fever, chills, night sweats, changes in weight, changes in appetite HEENT: Denies headaches, ear pain, changes in vision, rhinorrhea, sore throat CV: Denies CP, palpitations, SOB, orthopnea Pulm: Denies SOB, cough, wheezing GI: Denies abdominal pain, nausea, vomiting, diarrhea, constipation    +loose stools GU: Denies dysuria, hematuria, frequency, vaginal discharge   +increased urination. Msk: Denies muscle cramps, joint pains Neuro: Denies weakness, numbness, tingling Skin: Denies rashes, bruising Psych: Denies depression, anxiety, hallucinations     Objective:    Blood pressure (!) 138/96, pulse 80, temperature 97.7 F (36.5 C), temperature source Oral, weight 246 lb (111.6 kg), SpO2 97 %. bp recheck: 135/88  Gen. Pleasant, well-nourished, in no distress, normal affect   HEENT: [place]/AT, face symmetric, no scleral icterus, PERRLA, nares patent without drainage Lungs: no accessory muscle use, CTAB, no wheezes or rales Cardiovascular: RRR, no  m/r/g, no peripheral edema Abdomen: BS present, soft, NT/ND Neuro:  A&Ox3, CN II-XII intact, normal gait    Wt Readings from Last 3 Encounters:  04/13/17 246 lb (111.6 kg)  12/08/16 248 lb 3.2 oz (112.6 kg)  11/10/16 238 lb 9.6 oz (108.2 kg)    Lab Results  Component Value Date   WBC 5.7 10/27/2016   HGB 16.7 10/27/2016   HCT 48.6 10/27/2016   PLT 156.0 10/27/2016   GLUCOSE 216 (H) 10/27/2016   CHOL 191 10/27/2016   TRIG 118.0 10/27/2016   HDL 39.60 10/27/2016   LDLCALC 128 (H) 10/27/2016   ALT 20 10/27/2016   AST 14 10/27/2016   NA 134 (L) 10/27/2016   K 3.9 10/27/2016   CL 100 10/27/2016   CREATININE 0.84 10/27/2016   BUN 11 10/27/2016   CO2 27 10/27/2016   HGBA1C 10.5 10/27/2016    Assessment/Plan:  Diabetes mellitus without complication (HCC) -last hgb A1C was 10.5% on 10/27/16 -unable to repeat today as no POC test available.  Offered to send pt to the lab, but he declined, states he will come back to have Hgb A1C checked. -Discussed continued non-compliance.   -Pt to continue Metformin 750 mg daily -pt to check fsbs daily and keep a log -lifestyle modifications encouraged.  Essential hypertension -elevated -recheck 135/88 -continue lisinopril 20 mg daily -pt advised to try using a pill organizer at work so that he will have his meds readily available to take daily. -pt advised to obtain a bp cuff for home use  and to check his bp daily.  Pt to f/u in 1-2 months for DM and HTN.  Abbe Amsterdam, MD

## 2017-06-29 ENCOUNTER — Ambulatory Visit: Payer: Managed Care, Other (non HMO) | Admitting: Family Medicine

## 2017-09-05 ENCOUNTER — Ambulatory Visit (INDEPENDENT_AMBULATORY_CARE_PROVIDER_SITE_OTHER): Payer: BLUE CROSS/BLUE SHIELD | Admitting: Family Medicine

## 2017-09-05 DIAGNOSIS — Z23 Encounter for immunization: Secondary | ICD-10-CM

## 2017-09-07 LAB — TB SKIN TEST
INDURATION: 0 mm
TB SKIN TEST: NEGATIVE

## 2017-09-13 ENCOUNTER — Ambulatory Visit (INDEPENDENT_AMBULATORY_CARE_PROVIDER_SITE_OTHER): Payer: BLUE CROSS/BLUE SHIELD | Admitting: Family Medicine

## 2017-09-13 ENCOUNTER — Encounter: Payer: Self-pay | Admitting: Family Medicine

## 2017-09-13 VITALS — BP 130/82 | HR 72 | Temp 98.0°F | Ht 70.0 in | Wt 234.0 lb

## 2017-09-13 DIAGNOSIS — F17290 Nicotine dependence, other tobacco product, uncomplicated: Secondary | ICD-10-CM | POA: Diagnosis not present

## 2017-09-13 DIAGNOSIS — Z13220 Encounter for screening for lipoid disorders: Secondary | ICD-10-CM

## 2017-09-13 DIAGNOSIS — Z0000 Encounter for general adult medical examination without abnormal findings: Secondary | ICD-10-CM | POA: Diagnosis not present

## 2017-09-13 DIAGNOSIS — E119 Type 2 diabetes mellitus without complications: Secondary | ICD-10-CM | POA: Diagnosis not present

## 2017-09-13 LAB — LIPID PANEL
CHOL/HDL RATIO: 4
Cholesterol: 154 mg/dL (ref 0–200)
HDL: 40.9 mg/dL (ref >39.00)
LDL Cholesterol: 91 mg/dL (ref 0–99)
NONHDL: 112.7
Triglycerides: 111 mg/dL (ref 0.0–149.0)
VLDL: 22.2 mg/dL (ref 0.0–40.0)

## 2017-09-13 LAB — CBC WITH DIFFERENTIAL/PLATELET
BASOS PCT: 0.3 % (ref 0.0–3.0)
Basophils Absolute: 0 10*3/uL (ref 0.0–0.1)
EOS PCT: 1.2 % (ref 0.0–5.0)
Eosinophils Absolute: 0.1 10*3/uL (ref 0.0–0.7)
HCT: 45 % (ref 39.0–52.0)
HEMOGLOBIN: 15.6 g/dL (ref 13.0–17.0)
LYMPHS ABS: 2.7 10*3/uL (ref 0.7–4.0)
Lymphocytes Relative: 53.8 % — ABNORMAL HIGH (ref 12.0–46.0)
MCHC: 34.6 g/dL (ref 30.0–36.0)
MCV: 91.6 fl (ref 78.0–100.0)
MONO ABS: 0.5 10*3/uL (ref 0.1–1.0)
Monocytes Relative: 9.2 % (ref 3.0–12.0)
NEUTROS PCT: 35.5 % — AB (ref 43.0–77.0)
Neutro Abs: 1.8 10*3/uL (ref 1.4–7.7)
Platelets: 153 10*3/uL (ref 150.0–400.0)
RBC: 4.91 Mil/uL (ref 4.22–5.81)
RDW: 12.8 % (ref 11.5–15.5)
WBC: 5 10*3/uL (ref 4.0–10.5)

## 2017-09-13 LAB — COMPREHENSIVE METABOLIC PANEL
ALT: 21 U/L (ref 0–53)
AST: 15 U/L (ref 0–37)
Albumin: 4.6 g/dL (ref 3.5–5.2)
Alkaline Phosphatase: 57 U/L (ref 39–117)
BUN: 11 mg/dL (ref 6–23)
CHLORIDE: 107 meq/L (ref 96–112)
CO2: 29 meq/L (ref 19–32)
Calcium: 9.8 mg/dL (ref 8.4–10.5)
Creatinine, Ser: 0.97 mg/dL (ref 0.40–1.50)
GFR: 113.03 mL/min (ref >60.00)
GLUCOSE: 106 mg/dL — AB (ref 70–99)
POTASSIUM: 4.1 meq/L (ref 3.5–5.1)
SODIUM: 141 meq/L (ref 135–145)
TOTAL PROTEIN: 6.8 g/dL (ref 6.0–8.3)
Total Bilirubin: 0.6 mg/dL (ref 0.2–1.2)

## 2017-09-13 LAB — HEMOGLOBIN A1C: HEMOGLOBIN A1C: 6.3 % (ref 4.6–6.5)

## 2017-09-13 NOTE — Patient Instructions (Signed)
Preventive Care 18-39 Years, Male Preventive care refers to lifestyle choices and visits with your health care provider that can promote health and wellness. What does preventive care include?  A yearly physical exam. This is also called an annual well check.  Dental exams once or twice a year.  Routine eye exams. Ask your health care provider how often you should have your eyes checked.  Personal lifestyle choices, including: ? Daily care of your teeth and gums. ? Regular physical activity. ? Eating a healthy diet. ? Avoiding tobacco and drug use. ? Limiting alcohol use. ? Practicing safe sex. What happens during an annual well check? The services and screenings done by your health care provider during your annual well check will depend on your age, overall health, lifestyle risk factors, and family history of disease. Counseling Your health care provider may ask you questions about your:  Alcohol use.  Tobacco use.  Drug use.  Emotional well-being.  Home and relationship well-being.  Sexual activity.  Eating habits.  Work and work Statistician.  Screening You may have the following tests or measurements:  Height, weight, and BMI.  Blood pressure.  Lipid and cholesterol levels. These may be checked every 5 years starting at age 67.  Diabetes screening. This is done by checking your blood sugar (glucose) after you have not eaten for a while (fasting).  Skin check.  Hepatitis C blood test.  Hepatitis B blood test.  Sexually transmitted disease (STD) testing.  Discuss your test results, treatment options, and if necessary, the need for more tests with your health care provider. Vaccines Your health care provider may recommend certain vaccines, such as:  Influenza vaccine. This is recommended every year.  Tetanus, diphtheria, and acellular pertussis (Tdap, Td) vaccine. You may need a Td booster every 10 years.  Varicella vaccine. You may need this if you  have not been vaccinated.  HPV vaccine. If you are 14 or younger, you may need three doses over 6 months.  Measles, mumps, and rubella (MMR) vaccine. You may need at least one dose of MMR.You may also need a second dose.  Pneumococcal 13-valent conjugate (PCV13) vaccine. You may need this if you have certain conditions and have not been vaccinated.  Pneumococcal polysaccharide (PPSV23) vaccine. You may need one or two doses if you smoke cigarettes or if you have certain conditions.  Meningococcal vaccine. One dose is recommended if you are age 26-21 years and a first-year college student living in a residence hall, or if you have one of several medical conditions. You may also need additional booster doses.  Hepatitis A vaccine. You may need this if you have certain conditions or if you travel or work in places where you may be exposed to hepatitis A.  Hepatitis B vaccine. You may need this if you have certain conditions or if you travel or work in places where you may be exposed to hepatitis B.  Haemophilus influenzae type b (Hib) vaccine. You may need this if you have certain risk factors.  Talk to your health care provider about which screenings and vaccines you need and how often you need them. This information is not intended to replace advice given to you by your health care provider. Make sure you discuss any questions you have with your health care provider. Document Released: 03/14/2001 Document Revised: 10/06/2015 Document Reviewed: 11/17/2014 Elsevier Interactive Patient Education  2018 [employer]. Nicotine skin patches What is this medicine? NICOTINE ([place] teen) helps people stop smoking.  The patches replace the nicotine found in cigarettes and help to decrease withdrawal effects. They are most effective when used in combination with a stop-smoking program. This medicine may be used for other purposes; ask your health care provider or pharmacist if you have  questions. COMMON BRAND NAME(S): Habitrol, Nicoderm CQ, Nicotrol What should I tell my health care provider before I take this medicine? They need to know if you have any of these conditions: -diabetes -heart disease, angina, irregular heartbeat or previous heart attack -high blood pressure -lung disease, including asthma -overactive thyroid -pheochromocytoma -seizures or a history of seizures -skin problems, like eczema -stomach problems or ulcers -an unusual or allergic reaction to nicotine, adhesives, other medicines, foods, dyes, or preservatives -pregnant or trying to get pregnant -breast-feeding How should I use this medicine? This medicine is for use on the skin. Follow the directions that come with the patches. Find an area of skin on your upper arm, chest, or back that is clean, dry, greaseless, undamaged and hairless. Wash hands with plain soap and water. Do not use anything that contains aloe, lanolin or glycerin as these may prevent the patch from sticking. Dry thoroughly. Remove the patch from the sealed pouch. Do not try to cut or trim the patch. Using your palm, press the patch firmly in place for 10 seconds to make sure that there is good contact with your skin. After applying the patch, wash your hands. Change the patch every day, keeping to a regular schedule. When you apply a new patch, use a new area of skin. Wait at least 1 week before using the same area again. Talk to your pediatrician regarding the use of this medicine in children. Special care may be needed. Overdosage: If you think you have taken too much of this medicine contact a poison control center or emergency room at once. NOTE: This medicine is only for you. Do not share this medicine with others. What if I miss a dose? If you forget to replace a patch, use it as soon as you can. Only use one patch at a time and do not leave on the skin for longer than directed. If a patch falls off, you can replace it, but  keep to your schedule and remove the patch at the right time. What may interact with this medicine? -medicines for asthma -medicines for blood pressure -medicines for mental depression This list may not describe all possible interactions. Give your health care provider a list of all the medicines, herbs, non-prescription drugs, or dietary supplements you use. Also tell them if you smoke, drink alcohol, or use illegal drugs. Some items may interact with your medicine. What should I watch for while using this medicine? You should begin using the nicotine patch the day you stop smoking. It is okay if you do not succeed at your attempt to quit and have a cigarette. You can still continue your quit attempt and keep using the product as directed. Just throw away your cigarettes and get back to your quit plan. You can keep the patch in place during swimming, bathing, and showering. If your patch falls off during these activities, replace it. When you first apply the patch, your skin may itch or burn. This should go away soon. When you remove a patch, the skin may look red, but this should only last for a few days. Call your doctor or health care professional if skin redness does not go away after 4 days, if your skin swells,  or if you get a rash. If you are a diabetic and you quit smoking, the effects of insulin may be increased and you may need to reduce your insulin dose. Check with your doctor or health care professional about how you should adjust your insulin dose. If you are going to have a magnetic resonance imaging (MRI) procedure, tell your MRI technician if you have this patch on your body. It must be removed before a MRI. What side effects may I notice from receiving this medicine? Side effects that you should report to your doctor or health care professional as soon as possible: -allergic reactions like skin rash, itching or hives, swelling of the face, lips, or tongue -breathing  problems -changes in hearing -changes in vision -chest pain -cold sweats -confusion -fast, irregular heartbeat -feeling faint or lightheaded, falls -headache -increased saliva -skin redness that lasts more than 4 days -stomach pain -signs and symptoms of nicotine overdose like nausea; vomiting; dizziness; weakness; and rapid heartbeat Side effects that usually do not require medical attention (report to your doctor or health care professional if they continue or are bothersome): -diarrhea -dry mouth -hiccups -irritability -nervousness or restlessness -trouble sleeping or vivid dreams This list may not describe all possible side effects. Call your doctor for medical advice about side effects. You may report side effects to FDA at 1-800-FDA-1088. Where should I keep my medicine? Keep out of the reach of children. Store at room temperature between 20 and 25 degrees C (68 and 77 degrees F). Protect from heat and light. Store in International aid/development worker until ready to use. Throw away unused medicine after the expiration date. When you remove a patch, fold with sticky sides together; put in an empty opened pouch and throw away. NOTE: This sheet is a summary. It may not cover all possible information. If you have questions about this medicine, talk to your doctor, pharmacist, or health care provider.  2018 Elsevier/Gold Standard (2013-12-15 15:46:21) Nicotine chewing gum What is this medicine? NICOTINE ([place] teen) helps people stop smoking. This medicine replaces the nicotine found in cigarettes and helps to decrease withdrawal effects. It is most effective when used in combination with a stop-smoking program. This medicine may be used for other purposes; ask your health care provider or pharmacist if you have questions. COMMON BRAND NAME(S): NICOrelief, Nicorette What should I tell my health care provider before I take this medicine? They need to know if you have any of these  conditions: -diabetes -heart disease, angina, irregular heartbeat or previous heart attack -high blood pressure -lung disease, including asthma -overactive thyroid -pheochromocytoma -seizures or history of seizures -stomach problems or ulcers -an unusual or allergic reaction to nicotine, other medicines, foods, dyes, or preservatives -pregnant or trying to get pregnant -breast-feeding How should I use this medicine? Chew but do not swallow the gum. Follow the directions that come with the chewing gum. Use exactly as directed. When you feel an urgent desire for a cigarette, chew one piece of gum slowly. Continue chewing until you taste the gum or feel a slight tingling in your mouth. Then, stop chewing and place the gum between your cheek and gum. Wait until the taste or tingling is almost gone then start chewing again. Continue chewing in this manner for about 30 minutes. Slow chewing helps reduce cravings and also helps reduce the chance for heartburn or other gastrointestinal side effects. Talk to your pediatrician regarding the use of this medicine in children. Special care may be needed. Overdosage:  If you think you have taken too much of this medicine contact a poison control center or emergency room at once. NOTE: This medicine is only for you. Do not share this medicine with others. What if I miss a dose? This does not apply. Only use the chewing gum when you have a strong desire to smoke. Do not use more than one piece of gum at a time. What may interact with this medicine? -medicines for asthma -medicines for blood pressure -medicines for mental depression This list may not describe all possible interactions. Give your health care provider a list of all the medicines, herbs, non-prescription drugs, or dietary supplements you use. Also tell them if you smoke, drink alcohol, or use illegal drugs. Some items may interact with your medicine. What should I watch for while using this  medicine? Always carry the nicotine gum with you. Do not use more than 30 pieces of gum a day. Too much gum can increase the risk of an overdose. As the urge to smoke gets less, gradually reduce the number of pieces each day over a period of 2 to 3 months. When you are only using 1 or 2 pieces a day, stop using the nicotine gum. You should begin using the nicotine gum the day you stop smoking. It is okay if you do not succeed with the attempt to quit and have a cigarette. You can still continue your quit attempt and keep using the product as directed. Just throw away your cigarettes and get back to your quit plan. If your mouth gets sore from chewing the gum, suck hard sugarless candy between pieces of gum to help relieve the soreness. Brush your teeth regularly to reduce mouth irritation. If you wear dentures, contact your doctor or health care professional if the gum sticks to your dental work. If you are a diabetic and you quit smoking, the effects of insulin may be increased and you may need to reduce your insulin dose. Check with your doctor or health care professional about how you should adjust your insulin dose. What side effects may I notice from receiving this medicine? Side effects that you should report to your doctor or health care professional as soon as possible: -allergic reactions like skin rash, itching or hives, swelling of the face, lips, or tongue -blisters in mouth -breathing problems -changes in hearing -changes in vision -chest pain -cold sweats -confusion -fast, irregular heartbeat -feeling faint or lightheaded, falls -headache -increased saliva -nausea, vomiting -stomach pain -weakness Side effects that usually do not require medical attention (report to your doctor or health care professional if they continue or are bothersome): -diarrhea -dry mouth -hiccups -irritability -nervousness or restlessness -trouble sleeping or vivid dreams This list may not describe  all possible side effects. Call your doctor for medical advice about side effects. You may report side effects to FDA at 1-800-FDA-1088. Where should I keep my medicine? Keep out of the reach of children. Store at room temperature between 15 and 30 degrees C (59 and 86 degrees F). Protect from heat and light. Throw away unused medicine after the expiration date. NOTE: This sheet is a summary. It may not cover all possible information. If you have questions about this medicine, talk to your doctor, pharmacist, or health care provider.  2018 Elsevier/Gold Standard (2014-07-13 19:37:14)  Coping with Quitting Smoking Quitting smoking is a physical and mental challenge. You will face cravings, withdrawal symptoms, and temptation. Before quitting, work with your health care provider to make  a plan that can help you cope. Preparation can help you quit and keep you from giving in. How can I cope with cravings? Cravings usually last for 5-10 minutes. If you get through it, the craving will pass. Consider taking the following actions to help you cope with cravings:  Keep your mouth busy: ? Chew sugar-free gum. ? Suck on hard candies or a straw. ? Brush your teeth.  Keep your hands and body busy: ? Immediately change to a different activity when you feel a craving. ? Squeeze or play with a ball. ? Do an activity or a hobby, like making bead jewelry, practicing needlepoint, or working with wood. ? Mix up your normal routine. ? Take a short exercise break. Go for a quick walk or run up and down stairs. ? Spend time in public places where smoking is not allowed.  Focus on doing something kind or helpful for someone else.  Call a friend or family member to talk during a craving.  Join a support group.  Call a quit line, such as 1-800-QUIT-NOW.  Talk with your health care provider about medicines that might help you cope with cravings and make quitting easier for you.  How can I deal with  withdrawal symptoms? Your body may experience negative effects as it tries to get used to not having nicotine in the system. These effects are called withdrawal symptoms. They may include:  Feeling hungrier than normal.  Trouble concentrating.  Irritability.  Trouble sleeping.  Feeling depressed.  Restlessness and agitation.  Craving a cigarette.  To manage withdrawal symptoms:  Avoid places, people, and activities that trigger your cravings.  Remember why you want to quit.  Get plenty of sleep.  Avoid coffee and other caffeinated drinks. These may worsen some of your symptoms.  How can I handle social situations? Social situations can be difficult when you are quitting smoking, especially in the first few weeks. To manage this, you can:  Avoid parties, bars, and other social situations where people might be smoking.  Avoid alcohol.  Leave right away if you have the urge to smoke.  Explain to your family and friends that you are quitting smoking. Ask for understanding and support.  Plan activities with friends or family where smoking is not an option.  What are some ways I can cope with stress? Wanting to smoke may cause stress, and stress can make you want to smoke. Find ways to manage your stress. Relaxation techniques can help. For example:  Breathe slowly and deeply, in through your nose and out through your mouth.  Listen to soothing, relaxing music.  Talk with a family member or friend about your stress.  Light a candle.  Soak in a bath or take a shower.  Think about a peaceful place.  What are some ways I can prevent weight gain? Be aware that many people gain weight after they quit smoking. However, not everyone does. To keep from gaining weight, have a plan in place before you quit and stick to the plan after you quit. Your plan should include:  Having healthy snacks. When you have a craving, it may help to: ? Eat plain popcorn, crunchy carrots,  celery, or other cut vegetables. ? Chew sugar-free gum.  Changing how you eat: ? Eat small portion sizes at meals. ? Eat 4-6 small meals throughout the day instead of 1-2 large meals a day. ? Be mindful when you eat. Do not watch television or do other things that  might distract you as you eat.  Exercising regularly: ? Make time to exercise each day. If you do not have time for a long workout, do short bouts of exercise for 5-10 minutes several times a day. ? Do some form of strengthening exercise, like weight lifting, and some form of aerobic exercise, like running or swimming.  Drinking plenty of water or other low-calorie or no-calorie drinks. Drink 6-8 glasses of water daily, or as much as instructed by your health care provider.  Summary  Quitting smoking is a physical and mental challenge. You will face cravings, withdrawal symptoms, and temptation to smoke again. Preparation can help you as you go through these challenges.  You can cope with cravings by keeping your mouth busy (such as by chewing gum), keeping your body and hands busy, and making calls to family, friends, or a helpline for people who want to quit smoking.  You can cope with withdrawal symptoms by avoiding places where people smoke, avoiding drinks with caffeine, and getting plenty of rest.  Ask your health care provider about the different ways to prevent weight gain, avoid stress, and handle social situations. This information is not intended to replace advice given to you by your health care provider. Make sure you discuss any questions you have with your health care provider. Document Released: 01/14/2016 Document Revised: 01/14/2016 Document Reviewed: 01/14/2016 Elsevier Interactive Patient Education  [employer].

## 2017-09-13 NOTE — Progress Notes (Signed)
Subjective:     Brendan Hodges is a 37 y.o. male and is here for a comprehensive physical exam. The patient reports no problems.  Pt states he is working out more, eating better, but occasionally has a week where he will eat things he should not such as pizza.  Pt denies increased urination, increased hunger or thirst.  Pt states blood pressure has been good.  Taking lisinopril 20 mg daily.  Denies headaches, blurred vision, chest pain.  Pt states he started a new job.  He is currently working at [employer].  States he is moving more at this job.  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  Pt is expecting his fourth child.  He hopes it is a boy as he has 3 girls.  Social History   Socioeconomic History  . Marital status: Single    Spouse name: Not on file  . Number of children: Not on file  . Years of education: Not on file  . Highest education level: Not on file  Occupational History  . Not on file  Social Needs  . Financial resource strain: Not on file  . Food insecurity:    Worry: Not on file    Inability: Not on file  . Transportation needs:    Medical: Not on file    Non-medical: Not on file  Tobacco Use  . Smoking status: Current Every Day Smoker  . Smokeless tobacco: Never Used  Substance and Sexual Activity  . Alcohol use: Yes  . Drug use: No  . Sexual activity: Not on file  Lifestyle  . Physical activity:    Days per week: Not on file    Minutes per session: Not on file  . Stress: Not on file  Relationships  . Social connections:    Talks on phone: Not on file    Gets together: Not on file    Attends religious service: Not on file    Active member of club or organization: Not on file    Attends meetings of clubs or organizations: Not on file    Relationship status: Not on file  . Intimate partner violence:    Fear of current or ex partner: Not on file    Emotionally abused: Not on file    Physically abused: Not on file    Forced sexual activity: Not on  file  Other Topics Concern  . Not on file  Social History Narrative  . Not on file   Health Maintenance  Topic Date Due  . PNEUMOCOCCAL POLYSACCHARIDE VACCINE AGE 42-64 HIGH RISK  05/01/1984  . FOOT EXAM  05/01/1992  . OPHTHALMOLOGY EXAM  05/01/1992  . HIV Screening  05/01/1997  . TETANUS/TDAP  05/01/2001  . HEMOGLOBIN A1C  04/26/2017  . INFLUENZA VACCINE  08/30/2017    The following portions of the patient's history were reviewed and updated as appropriate: allergies, current medications, past family history, past medical history, past social history, past surgical history and problem list.  Review of Systems A comprehensive review of systems was negative.    Objective:    BP 130/82 (BP Location: Left Arm, Patient Position: Sitting, Cuff Size: Large)   Pulse 72   Temp 98 F (36.7 C) (Oral)   Ht 5\' 10"  (1.778 m)   Wt 234 lb (106.1 kg)   SpO2 97%   BMI 33.58 kg/m  General appearance: alert, cooperative, appears stated age and no distress Head: Normocephalic, without obvious abnormality, atraumatic Eyes:  conjunctivae/corneas clear. PERRL, EOM's intact. Fundi benign. Ears: normal TM's and external ear canals both ears Nose: Nares normal. Septum midline. Mucosa normal. No drainage or sinus tenderness. Throat: lips, mucosa, and tongue normal; teeth and gums normal Neck: no adenopathy, no carotid bruit, no JVD, supple, symmetrical, trachea midline and thyroid not enlarged, symmetric, no tenderness/mass/nodules Lungs: clear to auscultation bilaterally Heart: regular rate and rhythm, S1, S2 normal, no murmur, click, rub or gallop Abdomen: soft, non-tender; bowel sounds normal; no masses,  no organomegaly Extremities: extremities normal, atraumatic, no cyanosis or edema Skin: Skin color, texture, turgor normal. No rashes or lesions Neurologic: Alert and oriented X 3, normal strength and tone. Normal symmetric reflexes. Normal coordination and gait    Assessment:    Healthy male  exam.      Plan:     Anticipatory guidance given including wearing seatbelts, smoke detectors in the home, increasing physical activity, increasing p.o. intake of water and vegetables. -labs ordered this visit -handouts given -Influenza vaccine recommended when available, however patient declines See After Visit Summary for Counseling Recommendations   -will perform DM foot exam at next [facility]  Nicotine use -Smoking cessation counseling greater than 3 minutes, less than 10 minutes -Smoking 2 black and milds per day -Interested in quitting. -Given information about nicotine patches and gum.  Patient to read over and decide which method he would like to try. -We will reevaluate at each visit.  DM 2 -Continue metformin extended release 750 mg daily -Continue lifestyle modifications -We will obtain hemoglobin A1c this visit.  HTN -Controlled -Continue lisinopril 20 mg daily -Lifestyle modifications encouraged  Follow-up in the next 3-4 months  Abbe AmsterdamShannon Sotero Brinkmeyer, MD

## 2017-11-30 ENCOUNTER — Ambulatory Visit: Payer: BLUE CROSS/BLUE SHIELD | Admitting: Family Medicine

## 2017-11-30 ENCOUNTER — Encounter: Payer: Self-pay | Admitting: Family Medicine

## 2017-11-30 VITALS — BP 136/90 | HR 78 | Temp 97.6°F | Wt 248.0 lb

## 2017-11-30 DIAGNOSIS — F17290 Nicotine dependence, other tobacco product, uncomplicated: Secondary | ICD-10-CM | POA: Diagnosis not present

## 2017-11-30 DIAGNOSIS — R05 Cough: Secondary | ICD-10-CM

## 2017-11-30 DIAGNOSIS — I10 Essential (primary) hypertension: Secondary | ICD-10-CM | POA: Diagnosis not present

## 2017-11-30 DIAGNOSIS — R059 Cough, unspecified: Secondary | ICD-10-CM

## 2017-11-30 NOTE — Progress Notes (Signed)
Subjective:    Patient ID: Brendan Hodges, male    DOB: 09-20-1980, 37 y.o.   MRN: [id number]  No chief complaint on file. Pt is accompanied by his wife.  HPI Patient was seen today for ongoing concern.  Patient also endorses cough x1 year.  Cough is productive with clear/brown phlegm. Pt smoking 4 black and milds daily.  In the past pt smoked cigarettes.  Pt also endorses a "tickle" in the back of his throat which causes him to cough.  Past Medical History:  Diagnosis Date  . Diabetes mellitus without complication (HCC)   . Hypertension     No Known Allergies  ROS General: Denies fever, chills, night sweats, changes in weight, changes in appetite HEENT: Denies headaches, ear pain, changes in vision, rhinorrhea, sore throat  +throat irritation CV: Denies CP, palpitations, SOB, orthopnea Pulm: Denies SOB,wheezing +cough GI: Denies abdominal pain, nausea, vomiting, diarrhea, constipation GU: Denies dysuria, hematuria, frequency, vaginal discharge Msk: Denies muscle cramps, joint pains Neuro: Denies weakness, numbness, tingling Skin: Denies rashes, bruising Psych: Denies depression, anxiety, hallucinations    Objective:    Blood pressure 136/90, pulse 78, temperature 97.6 F (36.4 C), temperature source Oral, weight 248 lb (112.5 kg), SpO2 97 %.   Gen. Pleasant, well-nourished, in no distress, normal affect   HEENT: [place]/AT, face symmetric, no scleral icterus, PERRLA, nares patent without drainage, pharynx with erythema, no exudate.  TMs full bilaterally. Lungs: no accessory muscle use, CTAB, no wheezes or rales Cardiovascular: RRR, no m/r/g, no peripheral edema Neuro:  A&Ox3, CN II-XII intact, normal gait  Wt Readings from Last 3 Encounters:  11/30/17 248 lb (112.5 kg)  09/13/17 234 lb (106.1 kg)  04/13/17 246 lb (111.6 kg)    Lab Results  Component Value Date   WBC 5.0 09/13/2017   HGB 15.6 09/13/2017   HCT 45.0 09/13/2017   PLT 153.0 09/13/2017   GLUCOSE 106 (H)  09/13/2017   CHOL 154 09/13/2017   TRIG 111.0 09/13/2017   HDL 40.90 09/13/2017   LDLCALC 91 09/13/2017   ALT 21 09/13/2017   AST 15 09/13/2017   NA 141 09/13/2017   K 4.1 09/13/2017   CL 107 09/13/2017   CREATININE 0.97 09/13/2017   BUN 11 09/13/2017   CO2 29 09/13/2017   HGBA1C 6.3 09/13/2017    Assessment/Plan:  Cough -Discussed likely related to history of smoking, allergies, medication. -Discussed trying Flonase or OTC allergy medication. -Pt to use Flonase as he has some at home. -Patient to cut down on smoking. -If symptoms continue we will change blood pressure medications.  Other tobacco product nicotine dependence, uncomplicated -Smoking cessation counseling greater than 3 minutes, less than 10 minutes. -Patient smoking for black and milds per day -Patient agrees to cut down the number of black and milds smoked per day. -Also discussed medication options to help patient quit smoking. -We will readdress at each visit  HTN -Elevated -Continue lisinopril 20 mg daily -Discussed decreasing nicotine intake daily and increasing physical activity.  Follow-up PRN in 1 month, sooner if needed  Abbe Amsterdam, MD

## 2018-02-15 ENCOUNTER — Encounter: Payer: Self-pay | Admitting: Family Medicine

## 2018-02-15 ENCOUNTER — Ambulatory Visit: Payer: BLUE CROSS/BLUE SHIELD | Admitting: Family Medicine

## 2018-02-15 VITALS — BP 138/88 | HR 96 | Temp 97.7°F | Wt 250.4 lb

## 2018-02-15 DIAGNOSIS — E119 Type 2 diabetes mellitus without complications: Secondary | ICD-10-CM | POA: Diagnosis not present

## 2018-02-15 MED ORDER — GLIPIZIDE 5 MG PO TABS: 5.0000 mg | ORAL_TABLET | Freq: Two times a day (BID) | ORAL | 0 refills | Status: DC

## 2018-02-15 MED ORDER — METFORMIN HCL 500 MG PO TABS: 500.0000 mg | ORAL_TABLET | Freq: Two times a day (BID) | ORAL | 0 refills | Status: DC

## 2018-02-15 NOTE — Progress Notes (Signed)
   Subjective:    Patient ID: Brendan Hodges, male    DOB: 05/08/1980, 38 y.o.   MRN: [id number]  HPI Here for poorly controlled diabetes. He was diagnosed about 18 months ago and he had an A1c of 10.5. He made some big changes with diet and exercise and he was able to get the A1c down to 6.3 last August. He was taking Metformin XR 750 mg , but he stopped taking this about 5 months ago because he felt it made him tired. He admits to slipping a lot on the diet since then, and he usually eats fast food twice a day every day. His home glucometer stopped working, so he did not check his glucoses for 3 months. Then a few weeks ago he developed thirst and increased urination. Yesterday he purchased a new glucometer, and this morning the first reading was "error". He then got a reading of 582, so he made this appt. He feels fine today.   Review of Systems  Constitutional: Negative.   Respiratory: Negative.   Cardiovascular: Negative.   Neurological: Negative.        Objective:   Physical Exam Constitutional:      Appearance: Normal appearance.  Cardiovascular:     Rate and Rhythm: Normal rate and regular rhythm.     Pulses: Normal pulses.     Heart sounds: Normal heart sounds.  Pulmonary:     Effort: Pulmonary effort is normal.     Breath sounds: Normal breath sounds.  Neurological:     General: No focal deficit present.     Mental Status: He is alert and oriented to person, place, and time.           Assessment & Plan:  Poorly controlled type 2 diabetes. We will start him on Metformin 500 mg bid and Glipizide 5 mg bid. He will get back to a healthy diet. He will drink lots of  water. He will rest this weekend since he is off work. Follow up with Dr. Salomon FickBanks (his PCP) in one week.  Gershon CraneStephen Fry, MD

## 2018-02-16 LAB — HEMOGLOBIN A1C
Hgb A1c MFr Bld: 9.6 % of total Hgb — ABNORMAL HIGH (ref <5.7)
Mean Plasma Glucose: 229 (calc)
eAG (mmol/L): 12.7 (calc)

## 2018-02-25 ENCOUNTER — Ambulatory Visit: Payer: BLUE CROSS/BLUE SHIELD | Admitting: Family Medicine

## 2018-02-25 ENCOUNTER — Encounter: Payer: Self-pay | Admitting: Family Medicine

## 2018-02-25 VITALS — BP 120/82 | HR 72 | Temp 97.8°F | Wt 250.0 lb

## 2018-02-25 DIAGNOSIS — E119 Type 2 diabetes mellitus without complications: Secondary | ICD-10-CM | POA: Diagnosis not present

## 2018-02-25 MED ORDER — LISINOPRIL 2.5 MG PO TABS: 2.5000 mg | ORAL_TABLET | Freq: Every day | ORAL | 3 refills | Status: DC

## 2018-02-25 NOTE — Patient Instructions (Signed)
Preventing Diabetes Mellitus Complications You can take action to prevent or slow down problems that are caused by diabetes (diabetes mellitus). Following your diabetes plan and taking care of yourself can reduce your risk of serious or life-threatening complications. What actions can I take to prevent diabetes complications? Manage your diabetes   Follow instructions from your health care providers about managing your diabetes. Your diabetes may be managed by a team of health care providers who can teach you how to care for yourself and can answer questions that you have.  Educate yourself about your condition so you can make healthy choices about eating and physical activity.  Check your blood sugar (glucose) levels as often as directed. Your health care provider will help you decide how often to check your blood glucose level depending on your treatment goals and how well you are meeting them.  Ask your health care provider if you should take low-dose aspirin daily and what dose is recommended for you. Taking low-dose aspirin daily is recommended to help prevent cardiovascular disease. Do not use nicotine or tobacco Do not use any products that contain nicotine or tobacco, such as cigarettes and e-cigarettes. If you need help quitting, ask your health care provider. Nicotine raises your risk for diabetes problems. If you quit using nicotine:  You will lower your risk for heart attack, stroke, nerve disease, and kidney disease.  Your cholesterol and blood pressure may improve.  Your blood circulation will improve. Keep your blood pressure under control Your personal target blood pressure is determined based on:  Your age.  Your medicines.  How long you have had diabetes.  Any other medical conditions you have. To control your blood pressure:  Follow instructions from your health care provider about meal planning, exercise, and medicines.  Make sure your health care provider  checks your blood pressure at every medical visit.  Monitor your blood pressure at home as told by your health care provider.  Keep your cholesterol under control To control your cholesterol:  Follow instructions from your health care provider about meal planning, exercise, and medicines.  Have your cholesterol checked at least once a year.  You may be prescribed medicine to lower cholesterol (statin). If you are not taking a statin, ask your health care provider if you should be. Controlling your cholesterol may:  Help prevent heart disease and stroke. These are the most common health problems for people with diabetes.  Improve your blood flow. Schedule and keep yearly physical exams and eye exams Your health care provider will tell you how often you need medical visits depending on your diabetes management plan. Keep all follow-up visits as directed. This is important so possible problems can be identified early and complications can be avoided or treated.  Every visit with your health care provider should include measuring your: ? Weight. ? Blood pressure. ? Blood glucose control.  Your A1c (hemoglobin A1c) level should be checked: ? At least 2 times a year, if you are meeting your treatment goals. ? 4 times a year, if you are not meeting treatment goals or if your treatment goals have changed.  Your blood lipids (lipid profile) should be checked yearly. You should also be checked yearly for protein in your urine (urine microalbumin).  If you have type 1 diabetes, get an eye exam 3-5 years after you are diagnosed, and then once a year after your first exam.  If you have type 2 diabetes, get an eye exam as soon as you  are diagnosed, and then once a year after your first exam. Keep your vaccines current It is recommended that you receive:  A flu (influenza) vaccine every year.  A pneumonia (pneumococcal) vaccine and a hepatitis B vaccine. If you are age 36 or older, you may  get the pneumonia vaccine as a series of two separate shots. Ask your health care provider which other vaccines may be recommended. Take care of your feet Diabetes may cause you to have poor blood circulation to your legs and feet. Because of this, taking care of your feet is very important. Diabetes can cause:  The skin on the feet to get thinner, break more easily, and heal more slowly.  Nerve damage in your legs and feet, which results in decreased feeling. You may not notice minor injuries that could lead to serious problems. To avoid foot problems:  Check your skin and feet every day for cuts, bruises, redness, blisters, or sores.  Schedule a foot exam with your health care provider once every year. This exam includes: ? Inspecting of the structure and skin of your feet. ? Checking the pulses and sensation in your feet.  Make sure that your health care provider performs a visual foot exam at every medical visit.  Take care of your teeth People with poorly controlled diabetes are more likely to have gum (periodontal) disease. Diabetes can make periodontal diseases harder to control. If not treated, periodontal diseases can lead to tooth loss. To prevent this:  Brush your teeth twice a day.  Floss at least once a day.  Visit your dentist 2 times a year. Drink responsibly Limit alcohol intake to no more than 1 drink a day for nonpregnant women and 2 drinks a day for men. One drink equals 12 oz of beer, 5 oz of wine, or 1 oz of hard liquor.  It is important to eat food when you drink alcohol to avoid low blood glucose (hypoglycemia). Avoid alcohol if you:  Have a history of alcohol abuse or dependence.  Are pregnant.  Have liver disease, pancreatitis, advanced neuropathy, or severe hypertriglyceridemia. Lessen stress Living with diabetes can be stressful. When you are experiencing stress, your blood glucose may be affected in two ways:  Stress hormones may cause your blood  glucose to rise.  You may be distracted from taking good care of yourself. Be aware of your stress level and make changes to help you manage challenging situations. To lower your stress levels:  Consider joining a support group.  Do planned relaxation or meditation.  Do a hobby that you enjoy.  Maintain healthy relationships.  Exercise regularly.  Work with your health care provider or a mental health professional. Summary  You can take action to prevent or slow down problems that are caused by diabetes (diabetes mellitus). Following your diabetes plan and taking care of yourself can reduce your risk of serious or life-threatening complications.  Follow instructions from your health care providers about managing your diabetes. Your diabetes may be managed by a team of health care providers who can teach you how to care for yourself and can answer questions that you have.  Your health care provider will tell you how often you need medical visits depending on your diabetes management plan. Keep all follow-up visits as directed. This is important so possible problems can be identified early and complications can be avoided or treated. This information is not intended to replace advice given to you by your health care provider. Make sure  you discuss any questions you have with your health care provider. Document Released: 10/04/2010 Document Revised: 09/05/2016 Document Reviewed: 10/16/2015 Elsevier Interactive Patient Education  2019 [employer].  Tips for Eating Away From Home If You Have Diabetes Controlling your blood sugar (glucose) levels can be challenging when you do not prepare your own meals. The following tips can help you manage your diabetes when you eat away from home. If you have questions or if you need help, work with your health care provider or diet and nutrition specialist (dietitian). Planning ahead Plan ahead if you know you will be eating away from home:  Try to  eat your meals and snacks at about the same time each day. If you know your meal is going to be later than normal, make sure you have a small snack. Being very hungry can cause you to make unhealthy food choices.  Make a list of restaurants near you that offer healthy choices. If a restaurant has a carry-out menu, take the menu home and plan what you will order ahead of time.  Look up the restaurant you want to eat at online. Many chain and fast-food restaurants list nutritional information online. Use this information to choose the healthiest options and to calculate how many carbohydrates will be in your meal.  Use a carbohydrate-counting book or mobile app to look up the carbohydrate content and serving size of the foods you want to eat. Free foods A "free food" is any food or drink that has less than 5 grams of carbohydrates and less than 20 calories per serving. These food are high in fiber and nutrients and low in calories, carbohydrates, and fats. Free foods include:  Non-starchy vegetables, such as carrots, broccoli, celery, lettuce, or green beans.  Non-sugar drinks, such as water, unsweetened coffee, or unsweetened tea.  Low-calorie salad dressings.  Sugar-free gelatin. Starting meals with a salad full of vegetables is a healthy choice that includes a lot of free foods. Avoid high-calorie salad toppings like bacon, cheese, and high-fat dressings. Ask for your salad dressing to be served on the side so that you dip your fork in the dressing and then in the salad. This allows you to control how much dressing you eat and still get the flavor with every bite. Choices to control carbohydrates   Ask your server to take away the bread basket or chips from your table.  Choose light yogurt or [place] yogurt instead of non-fat sweetened yogurt.  Order fresh fruit. A salad bar often offers fresh fruit choices. Avoid canned fruit because it is usually packed in sugar or syrup.  Order a salad,  and ask for dressing on the side.  Ask for substitutes. For example, if your meal comes with french fries, ask for a side salad or steamed veggies instead. If a meal comes with fried chicken, ask for grilled chicken instead. Beverages  Choose drinks that are low in calories and sugar, such as: ? Water. ? Unsweetened tea or coffee. ? Lowfat milk.  Avoid the following drinks: ? Alcoholic beverages. ? Regular (not diet) sodas. Other tips  If you take insulin, wait to take your insulin once your food arrives to your table. This will ensure that your insulin and your food are timed correctly.  Become familiar with serving sizes and learn to recognize how many servings are in a portion. Restaurant portions are typically two to three times larger than what you really need.  Ask your server for a to-go box  at the beginning of the meal. When your food comes, leave the amount you should have on your plate, and put the rest in the to-go box so that you are not tempted to eat too much.  Consider splitting an entree with someone and ordering a side salad.  Avoid buffets. They are typically too tempting and result in overeating. Where to find more information  American Diabetes Association: www.diabetes.org  American Association of Diabetes Educators: www.diabeteseducator.org Summary  Plan ahead when eating away from home.  Try to eat your meals and snacks at about the same time each day. If you know your meal is going to be later than normal, make sure you have a small snack. Being very hungry can cause you to make unhealthy food choices.  Ask for substitutes. For example, if your meal comes with french fries, ask for a side salad or steamed veggies instead. If a meal comes with fried chicken, ask for grilled chicken instead.  Ask for a to-go box when you order your meal. Divide your meal before you start eating. This information is not intended to replace advice given to you by your health  care provider. Make sure you discuss any questions you have with your health care provider. Document Released: 01/16/2005 Document Revised: 04/26/2016 Document Reviewed: 04/26/2016 Elsevier Interactive Patient Education  2019 [employer].

## 2018-02-25 NOTE — Progress Notes (Signed)
Subjective:    Patient ID: Brendan Hodges, male    DOB: 01/07/1981, 38 y.o.   MRN: [id number]  No chief complaint on file.   HPI Patient was seen today for follow up on diabetes.  Pt seen on 02/15/18 by Dr. Clent Ridges for fsbs of 582. Hgb A1c was elevated at 9.6%.  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  Pt may get 4-5 hours of sleep per night.  Pt was started on glipizide 5 mg BID and metformin XR changed to metformin 500 mg BID.  Since last OFV, pt endorses checking fsbs TID.  Pt not always taking evening dose of meds as gets home around 11:30 pm.  Pt also notes he stopped taking lisinopril 20 mg as he didn't feel he needed it.  Bs this am was 160.  Pt denies increased thirst, increased hunger, increased urination.  Pt endorses fatigue but was unsure if was 2/2 work schedule.  Past Medical History:  Diagnosis Date  . Diabetes mellitus without complication (HCC)   . Hypertension     No Known Allergies  ROS General: Denies fever, chills, night sweats, changes in weight, changes in appetite  HEENT: Denies headaches, ear pain, changes in vision, rhinorrhea, sore throat CV: Denies CP, palpitations, SOB, orthopnea Pulm: Denies SOB, cough, wheezing GI: Denies abdominal pain, nausea, vomiting, diarrhea, constipation GU: Denies dysuria, hematuria, frequency, vaginal discharge Msk: Denies muscle cramps, joint pains Neuro: Denies weakness, numbness, tingling Skin: Denies rashes, bruising Psych: Denies depression, anxiety, hallucinations    Objective:    Blood pressure 120/82, pulse 72, temperature 97.8 F (36.6 C), temperature source Oral, weight 250 lb (113.4 kg), SpO2 98 %.  Gen. Pleasant, well-nourished, in no distress, normal affect   HEENT: [place]/AT, face symmetric, no scleral icterus, PERRLA, nares patent without drainage. Lungs: no accessory muscle use, CTAB, no wheezes or rales Cardiovascular: RRR, no m/r/g, no peripheral edema Abdomen: BS  present, soft, NT/ND Neuro:  A&Ox3, CN II-XII intact, normal gait Skin:  Warm, no lesions/ rash  Wt Readings from Last 3 Encounters:  02/25/18 250 lb (113.4 kg)  02/15/18 250 lb 6 oz (113.6 kg)  11/30/17 248 lb (112.5 kg)    Lab Results  Component Value Date   WBC 5.0 09/13/2017   HGB 15.6 09/13/2017   HCT 45.0 09/13/2017   PLT 153.0 09/13/2017   GLUCOSE 106 (H) 09/13/2017   CHOL 154 09/13/2017   TRIG 111.0 09/13/2017   HDL 40.90 09/13/2017   LDLCALC 91 09/13/2017   ALT 21 09/13/2017   AST 15 09/13/2017   NA 141 09/13/2017   K 4.1 09/13/2017   CL 107 09/13/2017   CREATININE 0.97 09/13/2017   BUN 11 09/13/2017   CO2 29 09/13/2017   HGBA1C 9.6 (H) 02/15/2018    Assessment/Plan:  Type 2 diabetes mellitus without complication, without long-term current use of insulin (HCC)  -uncontrolled -hgb A1C 9.6% on 02/15/18 -continue Metformin 500 mg twice daily and glipizide 5 mg twice daily -We will restart lisinopril at a lower dose for renal protection. -Pt encouraged to continue checking FS BS and keeping a log to bring with him to clinic or bring his meter. -The importance of lifestyle modifications stressed. -Pt given handouts -Discussed foot exam, pt declines at this time. - Plan: lisinopril (PRINIVIL,ZESTRIL) 2.5 MG tablet  F/u in 1 month.  Will need foot exam at that time.  Abbe Amsterdam, MD

## 2018-03-27 ENCOUNTER — Ambulatory Visit: Payer: BLUE CROSS/BLUE SHIELD | Admitting: Family Medicine

## 2018-03-28 ENCOUNTER — Ambulatory Visit: Payer: BLUE CROSS/BLUE SHIELD | Admitting: Family Medicine

## 2018-03-28 ENCOUNTER — Other Ambulatory Visit: Payer: Self-pay

## 2018-03-28 ENCOUNTER — Encounter: Payer: Self-pay | Admitting: Family Medicine

## 2018-03-28 VITALS — BP 98/74 | HR 76 | Temp 97.7°F | Wt 247.0 lb

## 2018-03-28 DIAGNOSIS — I10 Essential (primary) hypertension: Secondary | ICD-10-CM

## 2018-03-28 DIAGNOSIS — E119 Type 2 diabetes mellitus without complications: Secondary | ICD-10-CM | POA: Diagnosis not present

## 2018-03-28 MED ORDER — ONETOUCH VERIO VI STRP: ORAL_STRIP | 5 refills | Status: DC

## 2018-03-28 MED ORDER — METFORMIN HCL 500 MG PO TABS: ORAL_TABLET | ORAL | 3 refills | Status: DC

## 2018-03-28 NOTE — Progress Notes (Signed)
Subjective:    Patient ID: Brendan Hodges, male    DOB: 1980-03-20, 38 y.o.   MRN: [id number]  No chief complaint on file.   HPI Patient was seen today for f/u.  Pt states he is been working out at [employer], increasing his p.o. intake of water, and eating better.  Pt eating more salmon, chicken, vegetables.  Pt states he noticed his blood pressure is doing much better.  Pt is on lisinopril 2.5 mg.  Pt notes a.m. blood sugars typically 1 teens-120s, 150 was the highest.  Pt states he stopped taking glipizide 5 mg twice daily as he felt it was dropping his blood sugar.  Pt endorses an episode of hypoglycemia where he felt sweaty and shaky while at work.  FSBS was 73.  Past Medical History:  Diagnosis Date  . Diabetes mellitus without complication (HCC)   . Hypertension     No Known Allergies  ROS General: Denies fever, chills, night sweats, changes in weight, changes in appetite HEENT: Denies headaches, ear pain, changes in vision, rhinorrhea, sore throat CV: Denies CP, palpitations, SOB, orthopnea Pulm: Denies SOB, cough, wheezing GI: Denies abdominal pain, nausea, vomiting, diarrhea, constipation GU: Denies dysuria, hematuria, frequency, vaginal discharge Msk: Denies muscle cramps, joint pains Neuro: Denies weakness, numbness, tingling Skin: Denies rashes, bruising Psych: Denies depression, anxiety, hallucinations    Objective:    Blood pressure 98/74, pulse 76, temperature 97.7 F (36.5 C), temperature source Oral, weight 247 lb (112 kg), SpO2 98 %.  Gen. Pleasant, well-nourished, in no distress, normal affect  HEENT: [place]/AT, face symmetric, conjunctiva clear, no scleral icterus, PERRLA, EOMI, nares patent without drainage Lungs: no accessory muscle use, CTAB, no wheezes or rales Cardiovascular: RRR, no m/r/g, no peripheral edema Neuro:  A&Ox3, CN II-XII intact, normal gait Skin:  Warm, no lesions/ rash  Diabetic Foot Exam - Simple   Simple Foot Form Diabetic Foot exam was  performed with the following findings:  Yes 03/28/2018 12:59 PM  Visual Inspection No deformities, no ulcerations, no other skin breakdown bilaterally:  Yes Sensation Testing Intact to touch and monofilament testing bilaterally:  Yes Pulse Check Posterior Tibialis and Dorsalis pulse intact bilaterally:  Yes Comments     Wt Readings from Last 3 Encounters:  03/28/18 247 lb (112 kg)  02/25/18 250 lb (113.4 kg)  02/15/18 250 lb 6 oz (113.6 kg)    Lab Results  Component Value Date   WBC 5.0 09/13/2017   HGB 15.6 09/13/2017   HCT 45.0 09/13/2017   PLT 153.0 09/13/2017   GLUCOSE 106 (H) 09/13/2017   CHOL 154 09/13/2017   TRIG 111.0 09/13/2017   HDL 40.90 09/13/2017   LDLCALC 91 09/13/2017   ALT 21 09/13/2017   AST 15 09/13/2017   NA 141 09/13/2017   K 4.1 09/13/2017   CL 107 09/13/2017   CREATININE 0.97 09/13/2017   BUN 11 09/13/2017   CO2 29 09/13/2017   HGBA1C 9.6 (H) 02/15/2018    Assessment/Plan:  Diabetes mellitus without complication (HCC)  -last hgb A1C was 9.6% on 02/15/18 -foot exam done this visit -continue lifestyle modifications -will increase metformin to 1000 mg in am and 500 mg in pm -pt to d/c glipizide 5 mg BID. -continue checking fsbs at least BID. -pt congratulated on efforts at lifestyle modifications - Plan: metFORMIN (GLUCOPHAGE) 500 MG tablet, ONETOUCH VERIO test strip  Essential hypertension -controlled -continue lifestyle modifications -continue lisinopril 2.5 mg daily  F/u in 2-3 months, sooner if needed  Kush Farabee, MD 

## 2018-03-28 NOTE — Patient Instructions (Addendum)
We have increased your metformin to 1000 mg (2 tabs) in the morning and 500 mg (1 tab) in the evening.  You can stop taking the glipizide at this time.    Diabetes Mellitus and Standards of Medical Care Managing diabetes (diabetes mellitus) can be complicated. Your diabetes treatment may be managed by a team of health care providers, including:  A physician who specializes in diabetes (endocrinologist).  A nurse practitioner or physician assistant.  Nurses.  A diet and nutrition specialist (registered dietitian).  A certified diabetes educator (CDE).  An exercise specialist.  A pharmacist.  An eye doctor.  A foot specialist (podiatrist).  A dentist.  A primary care provider.  A mental health provider. Your health care providers follow guidelines to help you get the best quality of care. The following schedule is a general guideline for your diabetes management plan. Your health care providers may give you more specific instructions. Physical exams Upon being diagnosed with diabetes mellitus, and each year after that, your health care provider will ask about your medical and family history. He or she will also do a physical exam. Your exam may include:  Measuring your height, weight, and body mass index (BMI).  Checking your blood pressure. This will be done at every routine medical visit. Your target blood pressure may vary depending on your medical conditions, your age, and other factors.  Thyroid gland exam.  Skin exam.  Screening for damage to your nerves (peripheral neuropathy). This may include checking the pulse in your legs and feet and checking the level of sensation in your hands and feet.  A complete foot exam to inspect the structure and skin of your feet, including checking for cuts, bruises, redness, blisters, sores, or other problems.  Screening for blood vessel (vascular) problems, which may include checking the pulse in your legs and feet and checking your  temperature. Blood tests Depending on your treatment plan and your personal needs, you may have the following tests done:  HbA1c (hemoglobin A1c). This test provides information about blood sugar (glucose) control over the previous 2-3 months. It is used to adjust your treatment plan, if needed. This test will be done: ? At least 2 times a year, if you are meeting your treatment goals. ? 4 times a year, if you are not meeting your treatment goals or if treatment goals have changed.  Lipid testing, including total, LDL, and HDL cholesterol and triglyceride levels. ? The goal for LDL is less than 100 mg/dL (5.5 mmol/L). If you are at high risk for complications, the goal is less than 70 mg/dL (3.9 mmol/L). ? The goal for HDL is 40 mg/dL (2.2 mmol/L) or higher for men and 50 mg/dL (2.8 mmol/L) or higher for women. An HDL cholesterol of 60 mg/dL (3.3 mmol/L) or higher gives some protection against heart disease. ? The goal for triglycerides is less than 150 mg/dL (8.3 mmol/L).  Liver function tests.  Kidney function tests.  Thyroid function tests. Dental and eye exams  Visit your dentist two times a year.  If you have type 1 diabetes, your health care provider may recommend an eye exam 3-5 years after you are diagnosed, and then once a year after your first exam. ? For children with type 1 diabetes, a health care provider may recommend an eye exam when your child is age 19 or older and has had diabetes for 3-5 years. After the first exam, your child should get an eye exam once a year.  If you have type 2 diabetes, your health care provider may recommend an eye exam as soon as you are diagnosed, and then once a year after your first exam. Immunizations   The yearly flu (influenza) vaccine is recommended for everyone 6 months or older who has diabetes.  The pneumonia (pneumococcal) vaccine is recommended for everyone 2 years or older who has diabetes. If you are 70 or older, you may get the  pneumonia vaccine as a series of two separate shots.  The hepatitis B vaccine is recommended for adults shortly after being diagnosed with diabetes.  Adults and children with diabetes should receive all other vaccines according to age-specific recommendations from the Centers for Disease Control and Prevention (CDC). Mental and emotional health Screening for symptoms of eating disorders, anxiety, and depression is recommended at the time of diagnosis and afterward as needed. If your screening shows that you have symptoms (positive screening result), you may need more evaluation and you may work with a mental health care provider. Treatment plan Your treatment plan will be reviewed at every medical visit. You and your health care provider will discuss:  How you are taking your medicines, including insulin.  Any side effects you are experiencing.  Your blood glucose target goals.  The frequency of your blood glucose monitoring.  Lifestyle habits, such as activity level as well as tobacco, alcohol, and substance use. Diabetes self-management education Your health care provider will assess how well you are monitoring your blood glucose levels and whether you are taking your insulin correctly. He or she may refer you to:  A certified diabetes educator to manage your diabetes throughout your life, starting at diagnosis.  A registered dietitian who can create or review your personal nutrition plan.  An exercise specialist who can discuss your activity level and exercise plan. Summary  Managing diabetes (diabetes mellitus) can be complicated. Your diabetes treatment may be managed by a team of health care providers.  Your health care providers follow guidelines in order to help you get the best quality of care.  Standards of care including having regular physical exams, blood tests, blood pressure monitoring, immunizations, screening tests, and education about how to manage your  diabetes.  Your health care providers may also give you more specific instructions based on your individual health. This information is not intended to replace advice given to you by your health care provider. Make sure you discuss any questions you have with your health care provider. Document Released: 11/13/2008 Document Revised: 10/05/2017 Document Reviewed: 10/15/2015 Elsevier Interactive Patient Education  2019 [employer].

## 2018-06-26 ENCOUNTER — Ambulatory Visit: Payer: BLUE CROSS/BLUE SHIELD | Admitting: Family Medicine

## 2018-10-09 ENCOUNTER — Other Ambulatory Visit: Payer: Self-pay

## 2018-10-09 DIAGNOSIS — Z20822 Contact with and (suspected) exposure to covid-19: Secondary | ICD-10-CM

## 2018-10-10 LAB — NOVEL CORONAVIRUS, NAA: SARS-CoV-2, NAA: NOT DETECTED

## 2018-10-10 LAB — SPECIMEN STATUS REPORT

## 2019-06-07 ENCOUNTER — Ambulatory Visit (HOSPITAL_COMMUNITY)
Admission: EM | Admit: 2019-06-07 | Discharge: 2019-06-07 | Disposition: A | Discharge status: Home / Self Care | Payer: Managed Care, Other (non HMO) | Attending: Family Medicine | Admitting: Family Medicine

## 2019-06-07 ENCOUNTER — Encounter (HOSPITAL_COMMUNITY): Payer: Self-pay | Admitting: *Deleted

## 2019-06-07 ENCOUNTER — Other Ambulatory Visit: Payer: Self-pay

## 2019-06-07 DIAGNOSIS — E0865 Diabetes mellitus due to underlying condition with hyperglycemia: Secondary | ICD-10-CM

## 2019-06-07 DIAGNOSIS — R631 Polydipsia: Secondary | ICD-10-CM

## 2019-06-07 DIAGNOSIS — R350 Frequency of micturition: Secondary | ICD-10-CM

## 2019-06-07 LAB — POCT URINALYSIS DIP (DEVICE)
Bilirubin Urine: NEGATIVE
Glucose, UA: NEGATIVE mg/dL
Hgb urine dipstick: NEGATIVE
Ketones, ur: NEGATIVE mg/dL
Leukocytes,Ua: NEGATIVE
Nitrite: NEGATIVE
Protein, ur: NEGATIVE mg/dL
Specific Gravity, Urine: 1.02 (ref 1.005–1.030)
Urobilinogen, UA: 0.2 mg/dL (ref 0.0–1.0)
pH: 6 (ref 5.0–8.0)

## 2019-06-07 LAB — CBG MONITORING, ED: Glucose-Capillary: 159 mg/dL — ABNORMAL HIGH (ref 70–99)

## 2019-06-07 MED ORDER — METFORMIN HCL 500 MG PO TABS: 500.0000 mg | ORAL_TABLET | Freq: Two times a day (BID) | ORAL | 2 refills | Status: DC

## 2019-06-07 MED ORDER — BLOOD GLUCOSE MONITOR KIT: PACK | 0 refills | Status: AC

## 2019-06-07 NOTE — ED Triage Notes (Signed)
Pt has hx of being "pre-diabetic" - used to take metformin, but was able to stop when he lost weight.  Restarted old Rx of metformin approx 2 wks ago due to CBGs around 140-260 after gaining some weight.  Approx 10 days ago started with thirst and polyuria.  Also c/o dark, scaly lesion to left foot 2 wks ago.

## 2019-06-07 NOTE — ED Provider Notes (Signed)
[name]   [id number] 06/07/19 Arrival Time: 3354  ASSESSMENT & PLAN:  1. Diabetes mellitus due to underlying condition with hyperglycemia, without long-term current use of insulin ([place])   2. Increased thirst   3. Frequent urination     Begin: Meds ordered this encounter  Medications  . metFORMIN (GLUCOPHAGE) 500 MG tablet    Sig: Take 1 tablet (500 mg total) by mouth 2 (two) times daily with a meal.    Dispense:  60 tablet    Refill:  2  . blood glucose meter kit and supplies KIT    Sig: Dispense based on patient and insurance preference. Use up to four times daily as directed. (FOR ICD-9 250.00, 250.01).    Dispense:  1 each    Refill:  0    Order Specific Question:   Number of strips    Answer:   100    Order Specific Question:   Number of lancets    Answer:   100    Recommend: Follow-up Information    Schedule an appointment as soon as possible for a visit  with Billie Ruddy, MD.   Specialty: Family Medicine Contact information: [place] [id number] [phone]           May f/u here as needed.  Reviewed expectations re: course of current medical issues. Questions answered. Outlined signs and symptoms indicating need for more acute intervention. Understanding verbalized. After Visit Summary given.   SUBJECTIVE: History from: patient. Brendan Hodges is a 39 y.o. male who reports frequent urinary and increased thirst over the past two weeks. Reports h/o "pre-diabetes" and was on Metformin in the past. Stopped after weight loss. Used mother's meter to check blood sugar; running between 140-260. Started old metformin; several days. Eating better. Afebrile. No recent illnesses. Normal PO intake without n/v/d.    OBJECTIVE:  Vitals:   06/07/19 1019  BP: (!) 155/104  Pulse: 79  Resp: 14  Temp: 98.4 F (36.9 C)  TempSrc: Oral  SpO2: 99%    General appearance: alert; no distress Eyes: PERRLA; EOMI;  conjunctiva normal HENT: [place]; AT; nasal mucosa normal; oral mucosa normal Neck: supple  Lungs: speaks full sentences without difficulty; unlabored Extremities: no edema Skin: warm and dry Neurologic: normal gait Psychological: alert and cooperative; normal mood and affect  Labs: Results for orders placed or performed during the hospital encounter of 06/07/19  POC CBG monitoring  Result Value Ref Range   Glucose-Capillary 159 (H) 70 - 99 mg/dL  POCT urinalysis dip (device)  Result Value Ref Range   Glucose, UA NEGATIVE NEGATIVE mg/dL   Bilirubin Urine NEGATIVE NEGATIVE   Ketones, ur NEGATIVE NEGATIVE mg/dL   Specific Gravity, Urine 1.020 1.005 - 1.030   Hgb urine dipstick NEGATIVE NEGATIVE   pH 6.0 5.0 - 8.0   Protein, ur NEGATIVE NEGATIVE mg/dL   Urobilinogen, UA 0.2 0.0 - 1.0 mg/dL   Nitrite NEGATIVE NEGATIVE   Leukocytes,Ua NEGATIVE NEGATIVE      No Known Allergies  Past Medical History:  Diagnosis Date  . Diabetes mellitus without complication ([place])   . Hypertension    Social History   Socioeconomic History  . Marital status: Single    Spouse name: Not on file  . Number of children: Not on file  . Years of education: Not on file  . Highest education level: Not on file  Occupational History  . Not on file  Tobacco Use  . Smoking  status: Current Every Day Smoker  . Smokeless tobacco: Never Used  Substance and Sexual Activity  . Alcohol use: Yes    Comment: weekends  . Drug use: Never  . Sexual activity: Not on file  Other Topics Concern  . Not on file  Social History Narrative  . Not on file   Social Determinants of Health   Financial Resource Strain:   . Difficulty of Paying Living Expenses:   Food Insecurity:   . Worried About Charity fundraiser in the Last Year:   . Arboriculturist in the Last Year:   Transportation Needs:   . Film/video editor (Medical):   Marland Kitchen Lack of Transportation (Non-Medical):   Physical Activity:   . Days of Exercise  per Week:   . Minutes of Exercise per Session:   Stress:   . Feeling of Stress :   Social Connections:   . Frequency of Communication with Friends and Family:   . Frequency of Social Gatherings with Friends and Family:   . Attends Religious Services:   . Active Member of Clubs or Organizations:   . Attends Archivist Meetings:   Marland Kitchen Marital Status:   Intimate Partner Violence:   . Fear of Current or Ex-Partner:   . Emotionally Abused:   Marland Kitchen Physically Abused:   . Sexually Abused:    Family History  Problem Relation Age of Onset  . Breast cancer Mother   . Diabetes Mother   . Hypertension Mother   . Prostate cancer Father   . Diabetes Father   . Diabetes Brother   . Hypertension Brother   . Hypertension Maternal Grandmother   . Hypertension Paternal Grandmother    History reviewed. No pertinent surgical history.   Vanessa Kick, MD 06/07/19 [phone]

## 2019-08-07 ENCOUNTER — Other Ambulatory Visit: Payer: Self-pay

## 2019-08-07 ENCOUNTER — Ambulatory Visit (INDEPENDENT_AMBULATORY_CARE_PROVIDER_SITE_OTHER): Payer: BC Managed Care – PPO | Admitting: Family Medicine

## 2019-08-07 VITALS — BP 122/82 | HR 81 | Temp 97.7°F | Ht 70.0 in | Wt 245.1 lb

## 2019-08-07 DIAGNOSIS — E119 Type 2 diabetes mellitus without complications: Secondary | ICD-10-CM | POA: Diagnosis not present

## 2019-08-07 DIAGNOSIS — I10 Essential (primary) hypertension: Secondary | ICD-10-CM

## 2019-08-07 DIAGNOSIS — G8929 Other chronic pain: Secondary | ICD-10-CM

## 2019-08-07 DIAGNOSIS — Z0000 Encounter for general adult medical examination without abnormal findings: Secondary | ICD-10-CM

## 2019-08-07 DIAGNOSIS — M25511 Pain in right shoulder: Secondary | ICD-10-CM | POA: Diagnosis not present

## 2019-08-07 MED ORDER — LISINOPRIL 2.5 MG PO TABS: 2.5000 mg | ORAL_TABLET | Freq: Every day | ORAL | 3 refills | Status: DC

## 2019-08-07 MED ORDER — METFORMIN HCL 500 MG PO TABS: 500.0000 mg | ORAL_TABLET | Freq: Two times a day (BID) | ORAL | 2 refills | Status: DC

## 2019-08-07 MED ORDER — ONETOUCH VERIO VI STRP: ORAL_STRIP | 5 refills | Status: AC

## 2019-08-07 MED ORDER — BLOOD PRESSURE CUFF MISC: 0 refills | Status: AC

## 2019-08-07 NOTE — Progress Notes (Signed)
Subjective:     Brendan Hodges is a 39 y.o. male and is here for a comprehensive physical exam.  Pt has been out of Metformin and lisinopril for several months as he lost his insurance after changing jobs after being laid off. Pt notes fsbs has been in the 200s-300s.  Pt has not been checking his bp at home.  Was eating fast food.  Notes right shoulder pain x2 years, becoming worse.  Pt notes pulling sensation and feeling that arm will "fall off" while working.  Does not recall initial injury.  Not taking anything for the pain.  Endorses 10/10 pain while at work and sleeping on it.  Social History   Socioeconomic History  . Marital status: Single    Spouse name: Not on file  . Number of children: Not on file  . Years of education: Not on file  . Highest education level: Not on file  Occupational History  . Not on file  Tobacco Use  . Smoking status: Current Every Day Smoker  . Smokeless tobacco: Never Used  Vaping Use  . Vaping Use: Never used  Substance and Sexual Activity  . Alcohol use: Yes    Comment: weekends  . Drug use: Never  . Sexual activity: Not on file  Other Topics Concern  . Not on file  Social History Narrative  . Not on file   Social Determinants of Health   Financial Resource Strain:   . Difficulty of Paying Living Expenses:   Food Insecurity:   . Worried About Programme researcher, broadcasting/film/video in the Last Year:   . Barista in the Last Year:   Transportation Needs:   . Freight forwarder (Medical):   Marland Kitchen Lack of Transportation (Non-Medical):   Physical Activity:   . Days of Exercise per Week:   . Minutes of Exercise per Session:   Stress:   . Feeling of Stress :   Social Connections:   . Frequency of Communication with Friends and Family:   . Frequency of Social Gatherings with Friends and Family:   . Attends Religious Services:   . Active Member of Clubs or Organizations:   . Attends Banker Meetings:   Marland Kitchen Marital Status:   Intimate  Partner Violence:   . Fear of Current or Ex-Partner:   . Emotionally Abused:   Marland Kitchen Physically Abused:   . Sexually Abused:    Health Maintenance  Topic Date Due  . Hepatitis C Screening  Never done  . PNEUMOCOCCAL POLYSACCHARIDE VACCINE AGE 3-64 HIGH RISK  Never done  . OPHTHALMOLOGY EXAM  Never done  . COVID-19 Vaccine (1) Never done  . HIV Screening  Never done  . HEMOGLOBIN A1C  08/16/2018  . FOOT EXAM  03/29/2019  . INFLUENZA VACCINE  08/31/2019  . TETANUS/TDAP  06/10/2026    The following portions of the patient's history were reviewed and updated as appropriate: allergies, current medications, past family history, past medical history, past social history, past surgical history and problem list.  Review of Systems Pertinent items noted in HPI and remainder of comprehensive ROS otherwise negative.   Objective:    BP 122/82 (BP Location: Right Arm, Patient Position: Sitting, Cuff Size: Large)   Pulse 81   Temp 97.7 F (36.5 C) (Temporal)   Ht 5\' 10"  (1.778 m)   Wt 245 lb 2 oz (111.2 kg)   SpO2 97%   BMI 35.17 kg/m  General appearance: alert, cooperative and no  distress Head: Normocephalic, without obvious abnormality, atraumatic Eyes: conjunctivae/corneas clear. PERRL, EOM's intact. Fundi benign. Ears: normal TM's and external ear canals both ears Nose: Nares normal. Septum midline. Mucosa normal. No drainage or sinus tenderness. Throat: lips, mucosa, and tongue normal; teeth and gums normal Neck: no adenopathy, no carotid bruit, no JVD, supple, symmetrical, trachea midline and thyroid not enlarged, symmetric, no tenderness/mass/nodules Lungs: clear to auscultation bilaterally Heart: regular rate and rhythm, S1, S2 normal, no murmur, click, rub or gallop Abdomen: soft, non-tender; bowel sounds normal; no masses,  no organomegaly Extremities: Bilateral shoulders symmetric. TTP of FROM of b/l shoulder with mild discomfort of right shoulder with abduction. Positive  apprehension tes. Negative Hawkins, Neer's, empty can.   extremities normal, atraumatic, no cyanosis or edema Pulses: 2+ and symmetric Skin: Skin color, texture, turgor normal. No rashes or lesions Lymph nodes: Cervical, supraclavicular, and axillary nodes normal. Neurologic: Alert and oriented X 3, normal strength and tone. Normal symmetric reflexes. Normal coordination and gait    Assessment:    Healthy male exam with R shoulder pain.      Plan:     Anticipatory guidance given including wearing seatbelts, smoke detectors in the home, increasing physical activity, increasing p.o. intake of water and vegetables. -We will obtain labs. -Given haandout -Next CPE in 1 year See After Visit Summary for Counseling Recommendations    Essential hypertension  -Controlled -Continue lifestyle modifications -Restart lisinopril -Patient encouraged to check BP at home daily and bring a log in his meter to clinic. - Plan: Comprehensive metabolic panel, Blood Pressure Monitoring (BLOOD PRESSURE CUFF) MISC  Type 2 diabetes mellitus without complication, without long-term current use of insulin (HCC) -Continue lifestyle modifications -Patient encouraged to check FSBS daily -We will restart Metformin -Needs foot exam at next OV. -Patient to schedule ophthalmology visit for exam.  - Plan: Hemoglobin A1c, Lipid panel, lisinopril (ZESTRIL) 2.5 MG tablet, metFORMIN (GLUCOPHAGE) 500 MG tablet, ONETOUCH VERIO test strip  Chronic right shoulder pain  -Discussed possible causes including arthritis, bursitis, muscle strain, muscle tear -Supportive care -For continued symptoms consider referral to Ortho or sports med. - Plan: CBC with Differential/Platelet, DG Shoulder Right  Follow-up prn  Abbe Amsterdam, MD

## 2019-08-07 NOTE — Patient Instructions (Signed)
Preventive Care 19-37 Years Old, Male Preventive care refers to lifestyle choices and visits with your health care provider that can promote health and wellness. This includes:  A yearly physical exam. This is also called an annual well check.  Regular dental and eye exams.  Immunizations.  Screening for certain conditions.  Healthy lifestyle choices, such as eating a healthy diet, getting regular exercise, not using drugs or products that contain nicotine and tobacco, and limiting alcohol use. What can I expect for my preventive care visit? Physical exam Your health care provider will check:  Height and weight. These may be used to calculate body mass index (BMI), which is a measurement that tells if you are at a healthy weight.  Heart rate and blood pressure.  Your skin for abnormal spots. Counseling Your health care provider may ask you questions about:  Alcohol, tobacco, and drug use.  Emotional well-being.  Home and relationship well-being.  Sexual activity.  Eating habits.  Work and work Statistician. What immunizations do I need?  Influenza (flu) vaccine  This is recommended every year. Tetanus, diphtheria, and pertussis (Tdap) vaccine  You may need a Td booster every 10 years. Varicella (chickenpox) vaccine  You may need this vaccine if you have not already been vaccinated. Human papillomavirus (HPV) vaccine  If recommended by your health care provider, you may need three doses over 6 months. Measles, mumps, and rubella (MMR) vaccine  You may need at least one dose of MMR. You may also need a second dose. Meningococcal conjugate (MenACWY) vaccine  One dose is recommended if you are 45-76 years old and a Market researcher living in a residence hall, or if you have one of several medical conditions. You may also need additional booster doses. Pneumococcal conjugate (PCV13) vaccine  You may need this if you have certain conditions and were not  previously vaccinated. Pneumococcal polysaccharide (PPSV23) vaccine  You may need one or two doses if you smoke cigarettes or if you have certain conditions. Hepatitis A vaccine  You may need this if you have certain conditions or if you travel or work in places where you may be exposed to hepatitis A. Hepatitis B vaccine  You may need this if you have certain conditions or if you travel or work in places where you may be exposed to hepatitis B. Haemophilus influenzae type b (Hib) vaccine  You may need this if you have certain risk factors. You may receive vaccines as individual doses or as more than one vaccine together in one shot (combination vaccines). Talk with your health care provider about the risks and benefits of combination vaccines. What tests do I need? Blood tests  Lipid and cholesterol levels. These may be checked every 5 years starting at age 17.  Hepatitis C test.  Hepatitis B test. Screening   Diabetes screening. This is done by checking your blood sugar (glucose) after you have not eaten for a while (fasting).  Sexually transmitted disease (STD) testing. Talk with your health care provider about your test results, treatment options, and if necessary, the need for more tests. Follow these instructions at home: Eating and drinking   Eat a diet that includes fresh fruits and vegetables, whole grains, lean protein, and low-fat dairy products.  Take vitamin and mineral supplements as recommended by your health care provider.  Do not drink alcohol if your health care provider tells you not to drink.  If you drink alcohol: ? Limit how much you have to 0-2  drinks a day. ? Be aware of how much alcohol is in your drink. In the [place] one drink equals one 12 oz bottle of beer (355 mL), one 5 oz glass of wine (148 mL), or one 1 oz glass of hard liquor (44 mL). Lifestyle  Take daily care of your teeth and gums.  Stay active. Exercise for at least 30 minutes on 5 or  more days each week.  Do not use any products that contain nicotine or tobacco, such as cigarettes, e-cigarettes, and chewing tobacco. If you need help quitting, ask your health care provider.  If you are sexually active, practice safe sex. Use a condom or other form of protection to prevent STIs (sexually transmitted infections). What's next?  Go to your health care provider once a year for a well check visit.  Ask your health care provider how often you should have your eyes and teeth checked.  Stay up to date on all vaccines. This information is not intended to replace advice given to you by your health care provider. Make sure you discuss any questions you have with your health care provider. Document Revised: 01/10/2018 Document Reviewed: 01/10/2018 Elsevier Patient Education  2020 [employer].  Managing Your Hypertension Hypertension is commonly called high blood pressure. This is when the force of your blood pressing against the walls of your arteries is too strong. Arteries are blood vessels that carry blood from your heart throughout your body. Hypertension forces the heart to work harder to pump blood, and may cause the arteries to become narrow or stiff. Having untreated or uncontrolled hypertension can cause heart attack, stroke, kidney disease, and other problems. What are blood pressure readings? A blood pressure reading consists of a higher number over a lower number. Ideally, your blood pressure should be below 120/80. The first ("top") number is called the systolic pressure. It is a measure of the pressure in your arteries as your heart beats. The second ("bottom") number is called the diastolic pressure. It is a measure of the pressure in your arteries as the heart relaxes. What does my blood pressure reading mean? Blood pressure is classified into four stages. Based on your blood pressure reading, your health care provider may use the following stages to determine what type  of treatment you need, if any. Systolic pressure and diastolic pressure are measured in a unit called mm Hg. Normal  Systolic pressure: below 008.  Diastolic pressure: below 80. Elevated  Systolic pressure: 676-195.  Diastolic pressure: below 80. Hypertension stage 1  Systolic pressure: 093-267.  Diastolic pressure: 12-45. Hypertension stage 2  Systolic pressure: 809 or above.  Diastolic pressure: 90 or above. What health risks are associated with hypertension? Managing your hypertension is an important responsibility. Uncontrolled hypertension can lead to:  A heart attack.  A stroke.  A weakened blood vessel (aneurysm).  Heart failure.  Kidney damage.  Eye damage.  Metabolic syndrome.  Memory and concentration problems. What changes can I make to manage my hypertension? Hypertension can be managed by making lifestyle changes and possibly by taking medicines. Your health care provider will help you make a plan to bring your blood pressure within a normal range. Eating and drinking   Eat a diet that is high in fiber and potassium, and low in salt (sodium), added sugar, and fat. An example eating plan is called the DASH (Dietary Approaches to Stop Hypertension) diet. To eat this way: ? Eat plenty of fresh fruits and vegetables. Try to fill half  of your plate at each meal with fruits and vegetables. ? Eat whole grains, such as whole wheat pasta, brown rice, or whole grain bread. Fill about one quarter of your plate with whole grains. ? Eat low-fat diary products. ? Avoid fatty cuts of meat, processed or cured meats, and poultry with skin. Fill about one quarter of your plate with lean proteins such as fish, chicken without skin, beans, eggs, and tofu. ? Avoid premade and processed foods. These tend to be higher in sodium, added sugar, and fat.  Reduce your daily sodium intake. Most people with hypertension should eat less than 1,500 mg of sodium a day.  Limit alcohol  intake to no more than 1 drink a day for nonpregnant women and 2 drinks a day for men. One drink equals 12 oz of beer, 5 oz of wine, or 1 oz of hard liquor. Lifestyle  Work with your health care provider to maintain a healthy body weight, or to lose weight. Ask what an ideal weight is for you.  Get at least 30 minutes of exercise that causes your heart to beat faster (aerobic exercise) most days of the week. Activities may include walking, swimming, or biking.  Include exercise to strengthen your muscles (resistance exercise), such as weight lifting, as part of your weekly exercise routine. Try to do these types of exercises for 30 minutes at least 3 days a week.  Do not use any products that contain nicotine or tobacco, such as cigarettes and e-cigarettes. If you need help quitting, ask your health care provider.  Control any long-term (chronic) conditions you have, such as high cholesterol or diabetes. Monitoring  Monitor your blood pressure at home as told by your health care provider. Your personal target blood pressure may vary depending on your medical conditions, your age, and other factors.  Have your blood pressure checked regularly, as often as told by your health care provider. Working with your health care provider  Review all the medicines you take with your health care provider because there may be side effects or interactions.  Talk with your health care provider about your diet, exercise habits, and other lifestyle factors that may be contributing to hypertension.  Visit your health care provider regularly. Your health care provider can help you create and adjust your plan for managing hypertension. Will I need medicine to control my blood pressure? Your health care provider may prescribe medicine if lifestyle changes are not enough to get your blood pressure under control, and if:  Your systolic blood pressure is 130 or higher.  Your diastolic blood pressure is 80 or  higher. Take medicines only as told by your health care provider. Follow the directions carefully. Blood pressure medicines must be taken as prescribed. The medicine does not work as well when you skip doses. Skipping doses also puts you at risk for problems. Contact a health care provider if:  You think you are having a reaction to medicines you have taken.  You have repeated (recurrent) headaches.  You feel dizzy.  You have swelling in your ankles.  You have trouble with your vision. Get help right away if:  You develop a severe headache or confusion.  You have unusual weakness or numbness, or you feel faint.  You have severe pain in your chest or abdomen.  You vomit repeatedly.  You have trouble breathing. Summary  Hypertension is when the force of blood pumping through your arteries is too strong. If this condition is not controlled,  it may put you at risk for serious complications.  Your personal target blood pressure may vary depending on your medical conditions, your age, and other factors. For most people, a normal blood pressure is less than 120/80.  Hypertension is managed by lifestyle changes, medicines, or both. Lifestyle changes include weight loss, eating a healthy, low-sodium diet, exercising more, and limiting alcohol. This information is not intended to replace advice given to you by your health care provider. Make sure you discuss any questions you have with your health care provider. Document Revised: 05/10/2018 Document Reviewed: 12/15/2015 Elsevier Patient Education  [place].  Shoulder Pain Many things can cause shoulder pain, including:  An injury to the shoulder.  Overuse of the shoulder.  Arthritis. The source of the pain can be:  Inflammation.  An injury to the shoulder joint.  An injury to a tendon, ligament, or bone. Follow these instructions at home: Pay attention to changes in your symptoms. Let your health care provider know  about them. Follow these instructions to relieve your pain. If you have a sling:  Wear the sling as told by your health care provider. Remove it only as told by your health care provider.  Loosen the sling if your fingers tingle, become numb, or turn cold and blue.  Keep the sling clean.  If the sling is not waterproof: ? Do not let it get wet. Remove it to shower or bathe.  Move your arm as little as possible, but keep your hand moving to prevent swelling. Managing pain, stiffness, and swelling   If directed, put ice on the painful area: ? Put ice in a plastic bag. ? Place a towel between your skin and the bag. ? Leave the ice on for 20 minutes, 2-3 times per day. Stop applying ice if it does not help with the pain.  Squeeze a soft ball or a foam pad as much as possible. This helps to keep the shoulder from swelling. It also helps to strengthen the arm. General instructions  Take over-the-counter and prescription medicines only as told by your health care provider.  Keep all follow-up visits as told by your health care provider. This is important. Contact a health care provider if:  Your pain gets worse.  Your pain is not relieved with medicines.  New pain develops in your arm, hand, or fingers. Get help right away if:  Your arm, hand, or fingers: ? Tingle. ? Become numb. ? Become swollen. ? Become painful. ? Turn white or blue. Summary  Shoulder pain can be caused by an injury, overuse, or arthritis.  Pay attention to changes in your symptoms. Let your health care provider know about them.  This condition may be treated with a sling, ice, and pain medicines.  Contact your health care provider if the pain gets worse or new pain develops. Get help right away if your arm, hand, or fingers tingle or become numb, swollen, or painful.  Keep all follow-up visits as told by your health care provider. This is important. This information is not intended to replace advice  given to you by your health care provider. Make sure you discuss any questions you have with your health care provider. Document Revised: 07/31/2017 Document Reviewed: 07/31/2017 Elsevier Patient Education  [place].

## 2019-08-08 ENCOUNTER — Ambulatory Visit (INDEPENDENT_AMBULATORY_CARE_PROVIDER_SITE_OTHER)
Admission: RE | Admit: 2019-08-08 | Discharge: 2019-08-08 | Disposition: A | Discharge status: Home / Self Care | Payer: BC Managed Care – PPO | Source: Ambulatory Visit | Attending: Family Medicine | Admitting: Family Medicine

## 2019-08-08 DIAGNOSIS — G8929 Other chronic pain: Secondary | ICD-10-CM

## 2019-08-08 DIAGNOSIS — M25511 Pain in right shoulder: Secondary | ICD-10-CM | POA: Diagnosis not present

## 2019-08-14 DIAGNOSIS — M7581 Other shoulder lesions, right shoulder: Secondary | ICD-10-CM | POA: Diagnosis not present

## 2019-09-08 ENCOUNTER — Ambulatory Visit: Payer: BC Managed Care – PPO | Admitting: Family Medicine

## 2019-09-19 ENCOUNTER — Ambulatory Visit: Payer: BC Managed Care – PPO | Admitting: Family Medicine

## 2019-09-19 DIAGNOSIS — Z0289 Encounter for other administrative examinations: Secondary | ICD-10-CM

## 2019-10-03 ENCOUNTER — Other Ambulatory Visit: Payer: Self-pay

## 2019-10-03 DIAGNOSIS — Z20822 Contact with and (suspected) exposure to covid-19: Secondary | ICD-10-CM

## 2019-10-04 LAB — NOVEL CORONAVIRUS, NAA: SARS-CoV-2, NAA: NOT DETECTED

## 2019-11-06 DIAGNOSIS — Z0000 Encounter for general adult medical examination without abnormal findings: Secondary | ICD-10-CM | POA: Diagnosis not present

## 2019-11-06 DIAGNOSIS — E1165 Type 2 diabetes mellitus with hyperglycemia: Secondary | ICD-10-CM | POA: Diagnosis not present

## 2019-11-06 DIAGNOSIS — E785 Hyperlipidemia, unspecified: Secondary | ICD-10-CM | POA: Diagnosis not present

## 2019-11-06 DIAGNOSIS — M25511 Pain in right shoulder: Secondary | ICD-10-CM | POA: Diagnosis not present

## 2019-11-20 DIAGNOSIS — E782 Mixed hyperlipidemia: Secondary | ICD-10-CM | POA: Diagnosis not present

## 2019-11-20 DIAGNOSIS — E1165 Type 2 diabetes mellitus with hyperglycemia: Secondary | ICD-10-CM | POA: Diagnosis not present

## 2019-11-25 DIAGNOSIS — Z03818 Encounter for observation for suspected exposure to other biological agents ruled out: Secondary | ICD-10-CM | POA: Diagnosis not present

## 2019-11-25 DIAGNOSIS — Z20822 Contact with and (suspected) exposure to covid-19: Secondary | ICD-10-CM | POA: Diagnosis not present

## 2019-12-08 DIAGNOSIS — M25511 Pain in right shoulder: Secondary | ICD-10-CM | POA: Diagnosis not present

## 2019-12-18 DIAGNOSIS — E1165 Type 2 diabetes mellitus with hyperglycemia: Secondary | ICD-10-CM | POA: Diagnosis not present

## 2019-12-18 DIAGNOSIS — R739 Hyperglycemia, unspecified: Secondary | ICD-10-CM | POA: Diagnosis not present

## 2019-12-18 DIAGNOSIS — I10 Essential (primary) hypertension: Secondary | ICD-10-CM | POA: Diagnosis not present

## 2019-12-18 DIAGNOSIS — Z6832 Body mass index (BMI) 32.0-32.9, adult: Secondary | ICD-10-CM | POA: Diagnosis not present

## 2019-12-18 DIAGNOSIS — E785 Hyperlipidemia, unspecified: Secondary | ICD-10-CM | POA: Diagnosis not present

## 2019-12-22 ENCOUNTER — Telehealth: Payer: BC Managed Care – PPO | Admitting: Family Medicine

## 2020-01-01 DIAGNOSIS — E1165 Type 2 diabetes mellitus with hyperglycemia: Secondary | ICD-10-CM | POA: Diagnosis not present

## 2020-01-01 DIAGNOSIS — Z6832 Body mass index (BMI) 32.0-32.9, adult: Secondary | ICD-10-CM | POA: Diagnosis not present

## 2020-01-01 DIAGNOSIS — I10 Essential (primary) hypertension: Secondary | ICD-10-CM | POA: Diagnosis not present

## 2020-01-01 DIAGNOSIS — E785 Hyperlipidemia, unspecified: Secondary | ICD-10-CM | POA: Diagnosis not present

## 2020-03-02 DIAGNOSIS — Z20822 Contact with and (suspected) exposure to covid-19: Secondary | ICD-10-CM | POA: Diagnosis not present

## 2020-04-12 ENCOUNTER — Other Ambulatory Visit: Payer: Self-pay

## 2020-04-12 DIAGNOSIS — E119 Type 2 diabetes mellitus without complications: Secondary | ICD-10-CM

## 2020-04-12 MED ORDER — LISINOPRIL 2.5 MG PO TABS: 2.5000 mg | ORAL_TABLET | Freq: Every day | ORAL | 3 refills | Status: DC

## 2020-06-26 ENCOUNTER — Encounter: Payer: Self-pay | Admitting: Emergency Medicine

## 2020-06-26 ENCOUNTER — Ambulatory Visit
Admission: EM | Admit: 2020-06-26 | Discharge: 2020-06-26 | Disposition: A | Discharge status: Home / Self Care | Payer: BC Managed Care – PPO | Attending: Emergency Medicine | Admitting: Emergency Medicine

## 2020-06-26 ENCOUNTER — Other Ambulatory Visit: Payer: Self-pay

## 2020-06-26 DIAGNOSIS — R350 Frequency of micturition: Secondary | ICD-10-CM

## 2020-06-26 DIAGNOSIS — E119 Type 2 diabetes mellitus without complications: Secondary | ICD-10-CM

## 2020-06-26 DIAGNOSIS — Z7984 Long term (current) use of oral hypoglycemic drugs: Secondary | ICD-10-CM

## 2020-06-26 DIAGNOSIS — E118 Type 2 diabetes mellitus with unspecified complications: Secondary | ICD-10-CM | POA: Diagnosis not present

## 2020-06-26 LAB — POCT URINALYSIS DIP (MANUAL ENTRY)
Bilirubin, UA: NEGATIVE
Blood, UA: NEGATIVE
Glucose, UA: NEGATIVE mg/dL
Ketones, POC UA: NEGATIVE mg/dL
Leukocytes, UA: NEGATIVE
Nitrite, UA: NEGATIVE
Protein Ur, POC: NEGATIVE mg/dL
Spec Grav, UA: 1.025 (ref 1.010–1.025)
Urobilinogen, UA: 0.2 E.U./dL
pH, UA: 6 (ref 5.0–8.0)

## 2020-06-26 MED ORDER — METFORMIN HCL 1000 MG PO TABS
1000.0000 mg | ORAL_TABLET | Freq: Two times a day (BID) | ORAL | 0 refills | Status: DC
Start: 2020-06-26 — End: 2022-06-26

## 2020-06-26 NOTE — ED Triage Notes (Signed)
Pt sts some dysuria and frequency x 2 days; pt sts hx of DM checking CBG was 160 today

## 2020-06-26 NOTE — ED Provider Notes (Signed)
[facility]    CSN: [id number] Arrival date & time: 06/26/20  0957      History   Chief Complaint Chief Complaint  Patient presents with  . Dysuria    HPI Brendan Hodges is a 40 y.o. male history of hypertension, DM type II presenting today for evaluation of urinary frequency.  Reports over the past week he has noticed increased urinary frequency, reports urge to urinate every 5 to 6 minutes with incomplete voiding and very small amount of urination.  Denies significant associated dysuria.  Denies any hematuria.  Denies associated pain.  Denies any new partners or any concerns for STDs.  Denies penile discharge.  Denies testicle pain or swelling.  Reports he typically will notice urinary frequency when sugars are elevated, does report poor diet recently, sugar up to 160.  Has not followed up with PCP in a while.   HPI  Past Medical History:  Diagnosis Date  . Diabetes mellitus without complication ([place])   . Hypertension     Patient Active Problem List   Diagnosis Date Noted  . Diabetes mellitus without complication ([place]) 78/29/5621    History reviewed. No pertinent surgical history.     Home Medications    Prior to Admission medications   Medication Sig Start Date End Date Taking? Authorizing Provider  metFORMIN (GLUCOPHAGE) 1000 MG tablet Take 1 tablet (1,000 mg total) by mouth 2 (two) times daily. 06/26/20 07/26/20 Yes Caidin Heidenreich C, PA-C  BD MICROTAINER LANCETS MISC For checking blood sugar 3 times per day, more for hyper or hypoglycemia. 10/27/16   Billie Ruddy, MD  Blood Gluc Meter Disp-Strips (BLOOD GLUCOSE METER DISPOSABLE) DEVI For testing TID, more frequently if hypo or hyperglycemic. 10/27/16   Billie Ruddy, MD  blood glucose meter kit and supplies KIT Dispense based on patient and insurance preference. Use up to four times daily as directed. (FOR ICD-9 250.00, 250.01). 06/07/19   Vanessa Kick, MD  Blood Pressure Monitoring (BLOOD  PRESSURE CUFF) MISC Use as directed daily. 08/07/19   Billie Ruddy, MD  lisinopril (ZESTRIL) 2.5 MG tablet Take 1 tablet (2.5 mg total) by mouth daily. 04/12/20   Billie Ruddy, MD  ONETOUCH VERIO test strip USE TO TEST BLOOD SUGAR TID AND IF HYPO OR HYPERGLYCEMIC 08/07/19   Billie Ruddy, MD    Family History Family History  Problem Relation Age of Onset  . Breast cancer Mother   . Diabetes Mother   . Hypertension Mother   . Prostate cancer Father   . Diabetes Father   . Diabetes Brother   . Hypertension Brother   . Hypertension Maternal Grandmother   . Hypertension Paternal Grandmother     Social History Social History   Tobacco Use  . Smoking status: Current Every Day Smoker  . Smokeless tobacco: Never Used  Vaping Use  . Vaping Use: Never used  Substance Use Topics  . Alcohol use: Yes    Comment: weekends  . Drug use: Never     Allergies   Patient has no known allergies.   Review of Systems Review of Systems  Constitutional: Negative for fever.  HENT: Negative for sore throat.   Respiratory: Negative for shortness of breath.   Cardiovascular: Negative for chest pain.  Gastrointestinal: Negative for abdominal pain, nausea and vomiting.  Genitourinary: Positive for frequency. Negative for difficulty urinating, dysuria, penile discharge, penile pain, penile swelling, scrotal swelling and testicular pain.  Skin: Negative for rash.  Neurological:  Negative for dizziness, light-headedness and headaches.     Physical Exam Triage Vital Signs ED Triage Vitals  Enc Vitals Group     BP      Pulse      Resp      Temp      Temp src      SpO2      Weight      Height      Head Circumference      Peak Flow      Pain Score      Pain Loc      Pain Edu?      Excl. in [place]?    No data found.  Updated Vital Signs BP (!) 148/92 (BP Location: Left Arm)   Pulse 70   Temp 97.9 F (36.6 C) (Oral)   Resp 18   SpO2 97%   Visual Acuity Right Eye Distance:    Left Eye Distance:   Bilateral Distance:    Right Eye Near:   Left Eye Near:    Bilateral Near:     Physical Exam Vitals and nursing note reviewed.  Constitutional:      Appearance: He is well-developed.     Comments: No acute distress  HENT:     Head: Normocephalic and atraumatic.     Nose: Nose normal.  Eyes:     Conjunctiva/sclera: Conjunctivae normal.  Cardiovascular:     Rate and Rhythm: Normal rate.  Pulmonary:     Effort: Pulmonary effort is normal. No respiratory distress.  Abdominal:     General: There is no distension.  Musculoskeletal:        General: Normal range of motion.     Cervical back: Neck supple.  Skin:    General: Skin is warm and dry.  Neurological:     Mental Status: He is alert and oriented to person, place, and time.      UC Treatments / Results  Labs (all labs ordered are listed, but only abnormal results are displayed) Labs Reviewed  URINE CULTURE  CBC  COMPREHENSIVE METABOLIC PANEL  PSA  HEMOGLOBIN A1C  POCT URINALYSIS DIP (MANUAL ENTRY)    EKG   Radiology No results found.  Procedures Procedures (including critical care time)  Medications Ordered in UC Medications - No data to display  Initial Impression / Assessment and Plan / UC Course  I have reviewed the triage vital signs and the nursing notes.  Pertinent labs & imaging results that were available during my care of the patient were reviewed by me and considered in my medical decision making (see chart for details).     UA unremarkable, negative for glucose, hemoglobin, leuks and nitrites, patient declines any suspicion of STD at all, will defer testing for this.  Encouraged continued close monitoring of sugars, increasing fluids and adherence to diabetes medicine, has not had A1c checked in over 1 year, will recheck today along with basic labs and PSA to evaluate prostate.  Encouraged follow-up with PCP/urology if symptoms persisting.  Discussed strict return  precautions. Patient verbalized understanding and is agreeable with plan.  Final Clinical Impressions(s) / UC Diagnoses   Final diagnoses:  Urinary frequency  Diabetes mellitus without complication ([name])     Discharge Instructions     No signs of infection on urine I am checking blood work to check your prostate as well as recheck your A1c Please establish care with new primary care-contact below Increase metformin from 500 mg twice daily to  1000 mg twice daily  Please follow-up if any symptoms not improving or worsening    ED Prescriptions    Medication Sig Dispense Auth. Provider   metFORMIN (GLUCOPHAGE) 1000 MG tablet Take 1 tablet (1,000 mg total) by mouth 2 (two) times daily. 60 tablet Ralphie Lovelady, [place] C, PA-C     PDMP not reviewed this encounter.   Alix Stowers, [place] C, PA-C 06/26/20 1226

## 2020-06-26 NOTE — Discharge Instructions (Addendum)
No signs of infection on urine I am checking blood work to check your prostate as well as recheck your A1c Please establish care with new primary care-contact below Increase metformin from 500 mg twice daily to 1000 mg twice daily  Please follow-up if any symptoms not improving or worsening

## 2020-06-27 LAB — URINE CULTURE: Culture: NO GROWTH

## 2020-06-29 LAB — COMPREHENSIVE METABOLIC PANEL
ALT: 18 IU/L (ref 0–44)
AST: 15 IU/L (ref 0–40)
Albumin/Globulin Ratio: 2.9 — ABNORMAL HIGH (ref 1.2–2.2)
Albumin: 5 g/dL (ref 4.0–5.0)
Alkaline Phosphatase: 63 IU/L (ref 44–121)
BUN/Creatinine Ratio: 18 (ref 9–20)
BUN: 13 mg/dL (ref 6–20)
Bilirubin Total: 0.3 mg/dL (ref 0.0–1.2)
CO2: 19 mmol/L — ABNORMAL LOW (ref 20–29)
Calcium: 9.7 mg/dL (ref 8.7–10.2)
Chloride: 101 mmol/L (ref 96–106)
Creatinine, Ser: 0.74 mg/dL — ABNORMAL LOW (ref 0.76–1.27)
Globulin, Total: 1.7 g/dL (ref 1.5–4.5)
Glucose: 174 mg/dL — ABNORMAL HIGH (ref 65–99)
Potassium: 4.6 mmol/L (ref 3.5–5.2)
Sodium: 136 mmol/L (ref 134–144)
Total Protein: 6.7 g/dL (ref 6.0–8.5)
eGFR: 119 mL/min/{1.73_m2} (ref >59)

## 2020-06-29 LAB — CBC
Hematocrit: 47.1 % (ref 37.5–51.0)
Hemoglobin: 16.2 g/dL (ref 13.0–17.7)
MCH: 31.1 pg (ref 26.6–33.0)
MCHC: 34.4 g/dL (ref 31.5–35.7)
MCV: 90 fL (ref 79–97)
Platelets: 152 10*3/uL (ref 150–450)
RBC: 5.21 x10E6/uL (ref 4.14–5.80)
RDW: 11.7 % (ref 11.6–15.4)
WBC: 5.3 10*3/uL (ref 3.4–10.8)

## 2020-06-29 LAB — HEMOGLOBIN A1C
Est. average glucose Bld gHb Est-mCnc: 243 mg/dL
Hgb A1c MFr Bld: 10.1 % — ABNORMAL HIGH (ref 4.8–5.6)

## 2020-06-29 LAB — PSA: Prostate Specific Ag, Serum: 0.4 ng/mL (ref 0.0–4.0)

## 2020-07-27 ENCOUNTER — Encounter: Payer: Self-pay | Admitting: Emergency Medicine

## 2020-12-07 ENCOUNTER — Other Ambulatory Visit: Payer: Self-pay | Admitting: Family Medicine

## 2020-12-07 DIAGNOSIS — E119 Type 2 diabetes mellitus without complications: Secondary | ICD-10-CM

## 2021-11-27 IMAGING — DX DG SHOULDER 2+V*R*
3 series · 3 of 3 positions shown · non-contrast
Comparison: 09/05/2010

CLINICAL DATA: Right shoulder pain.

EXAM:
RIGHT SHOULDER - 2+ VIEW

[grashey]
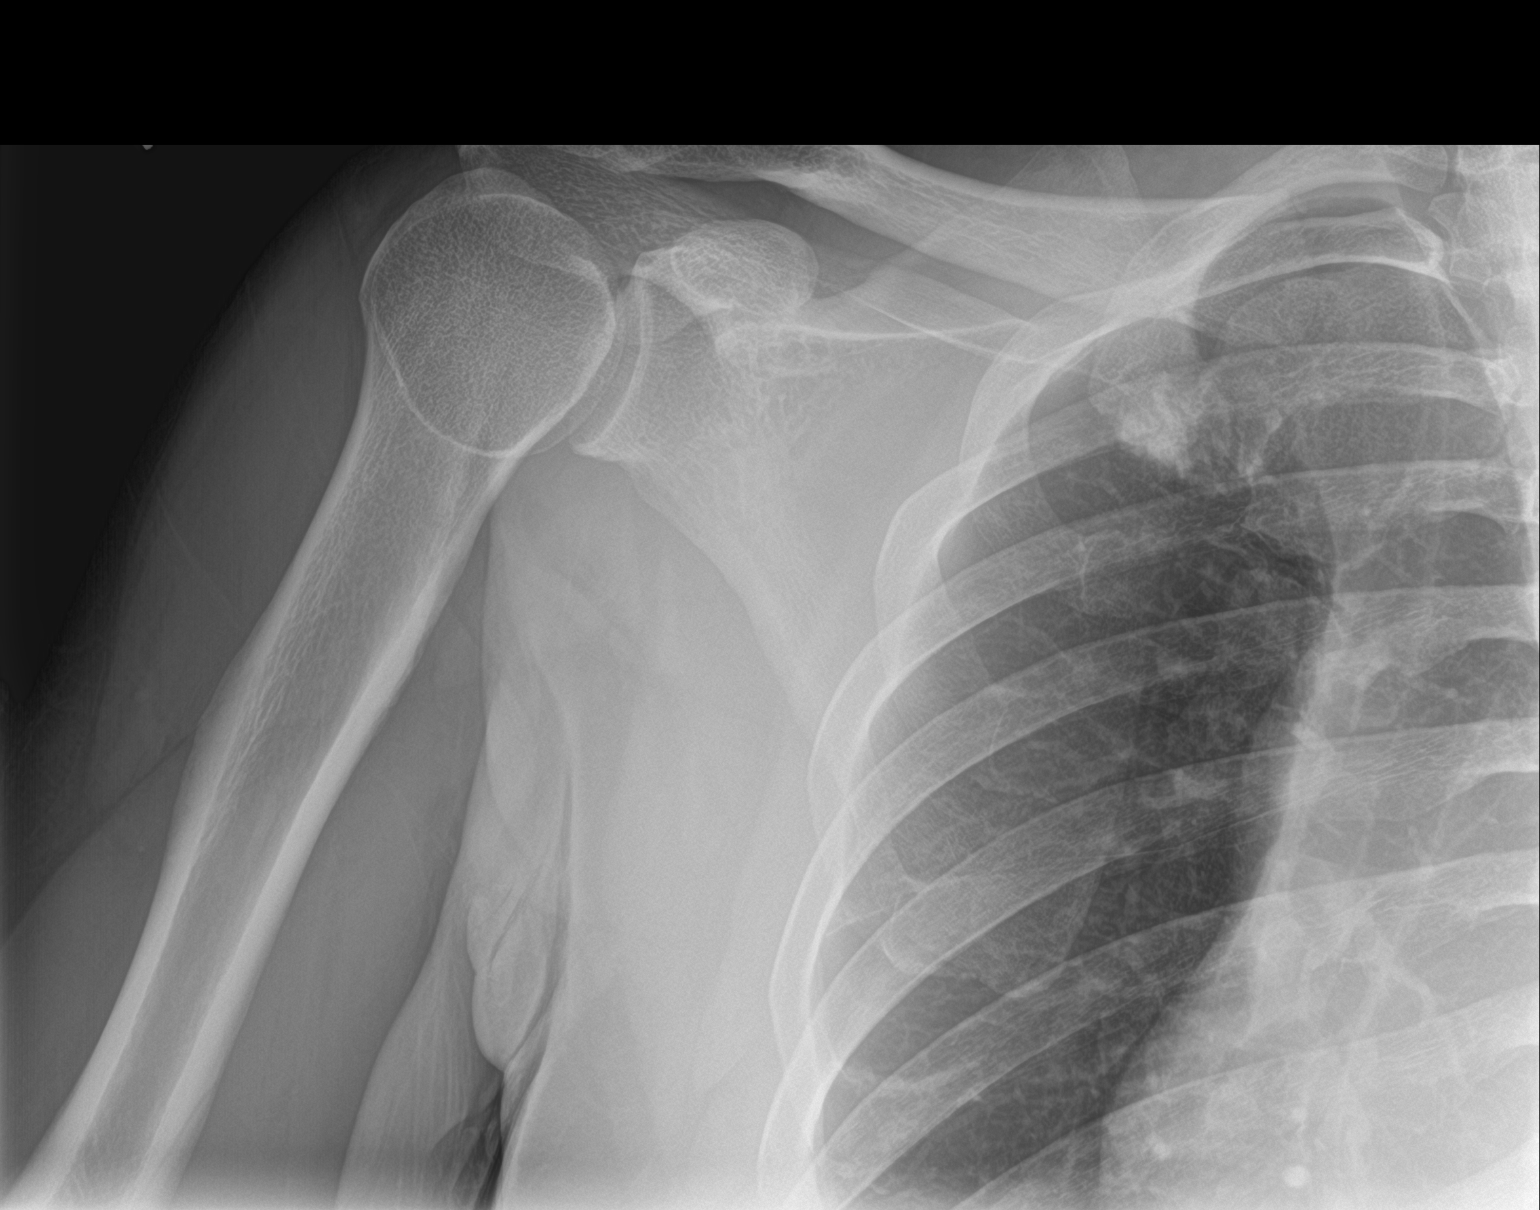

[y view]
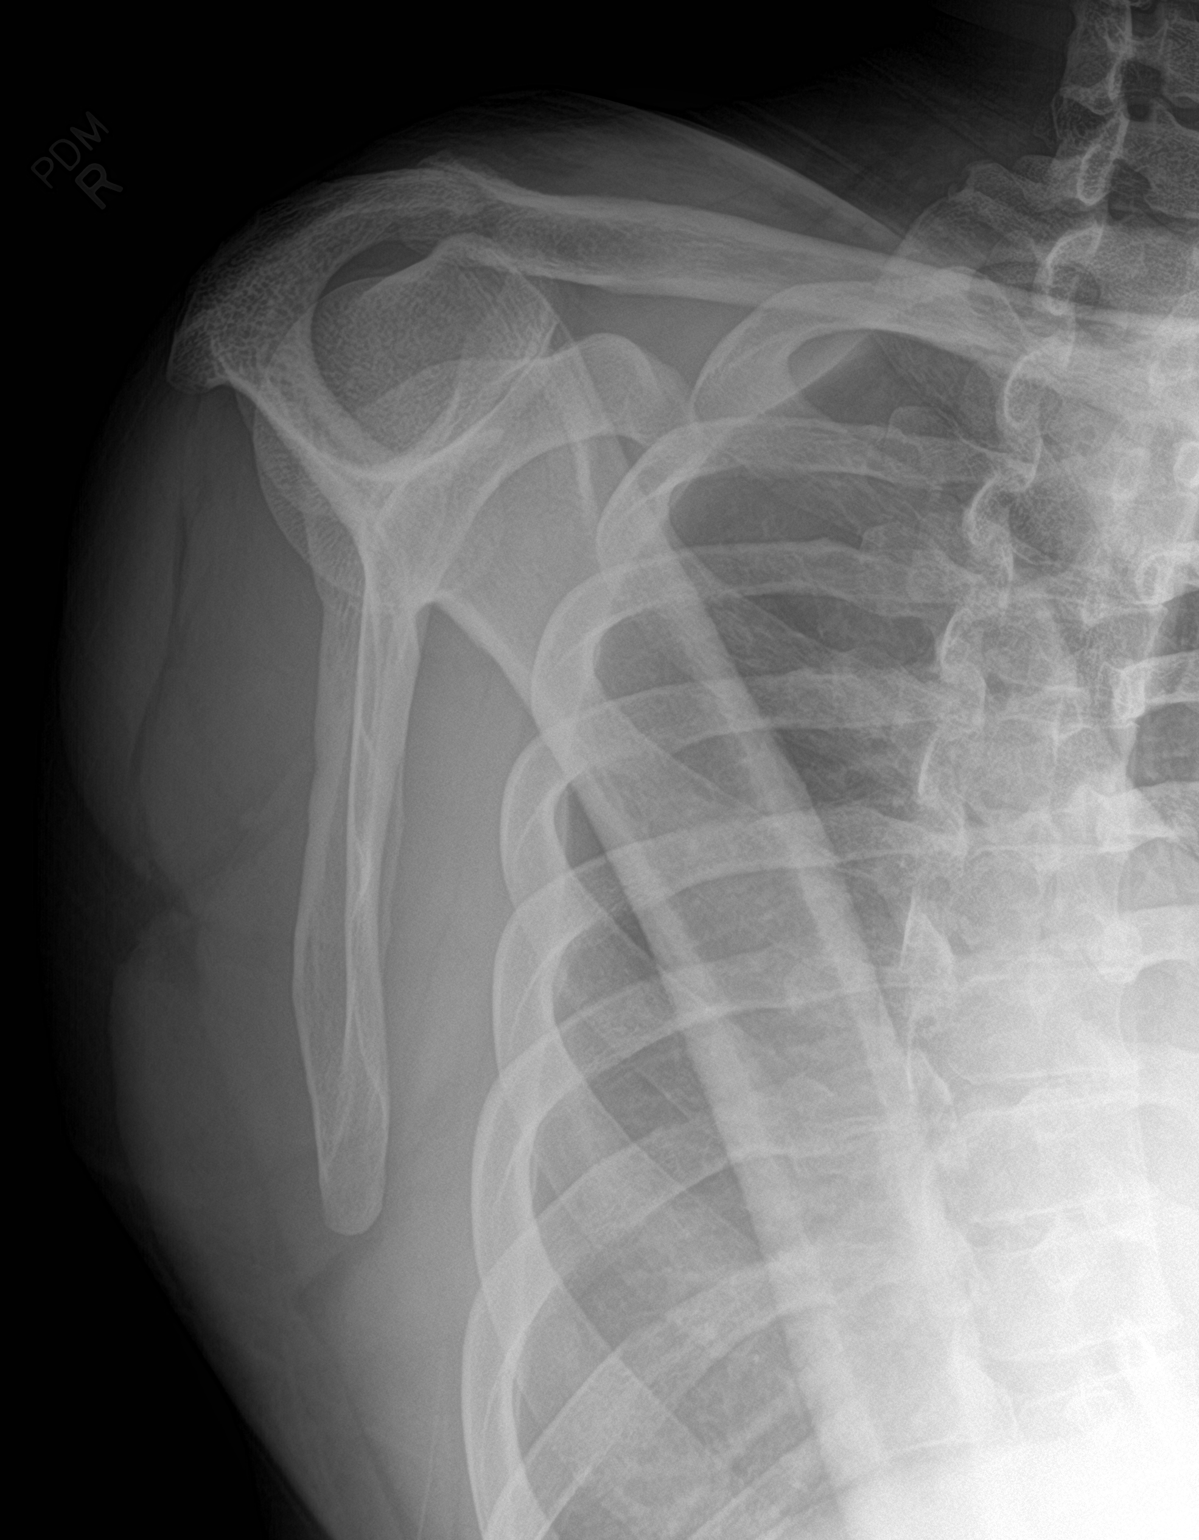

[shoulder axial]
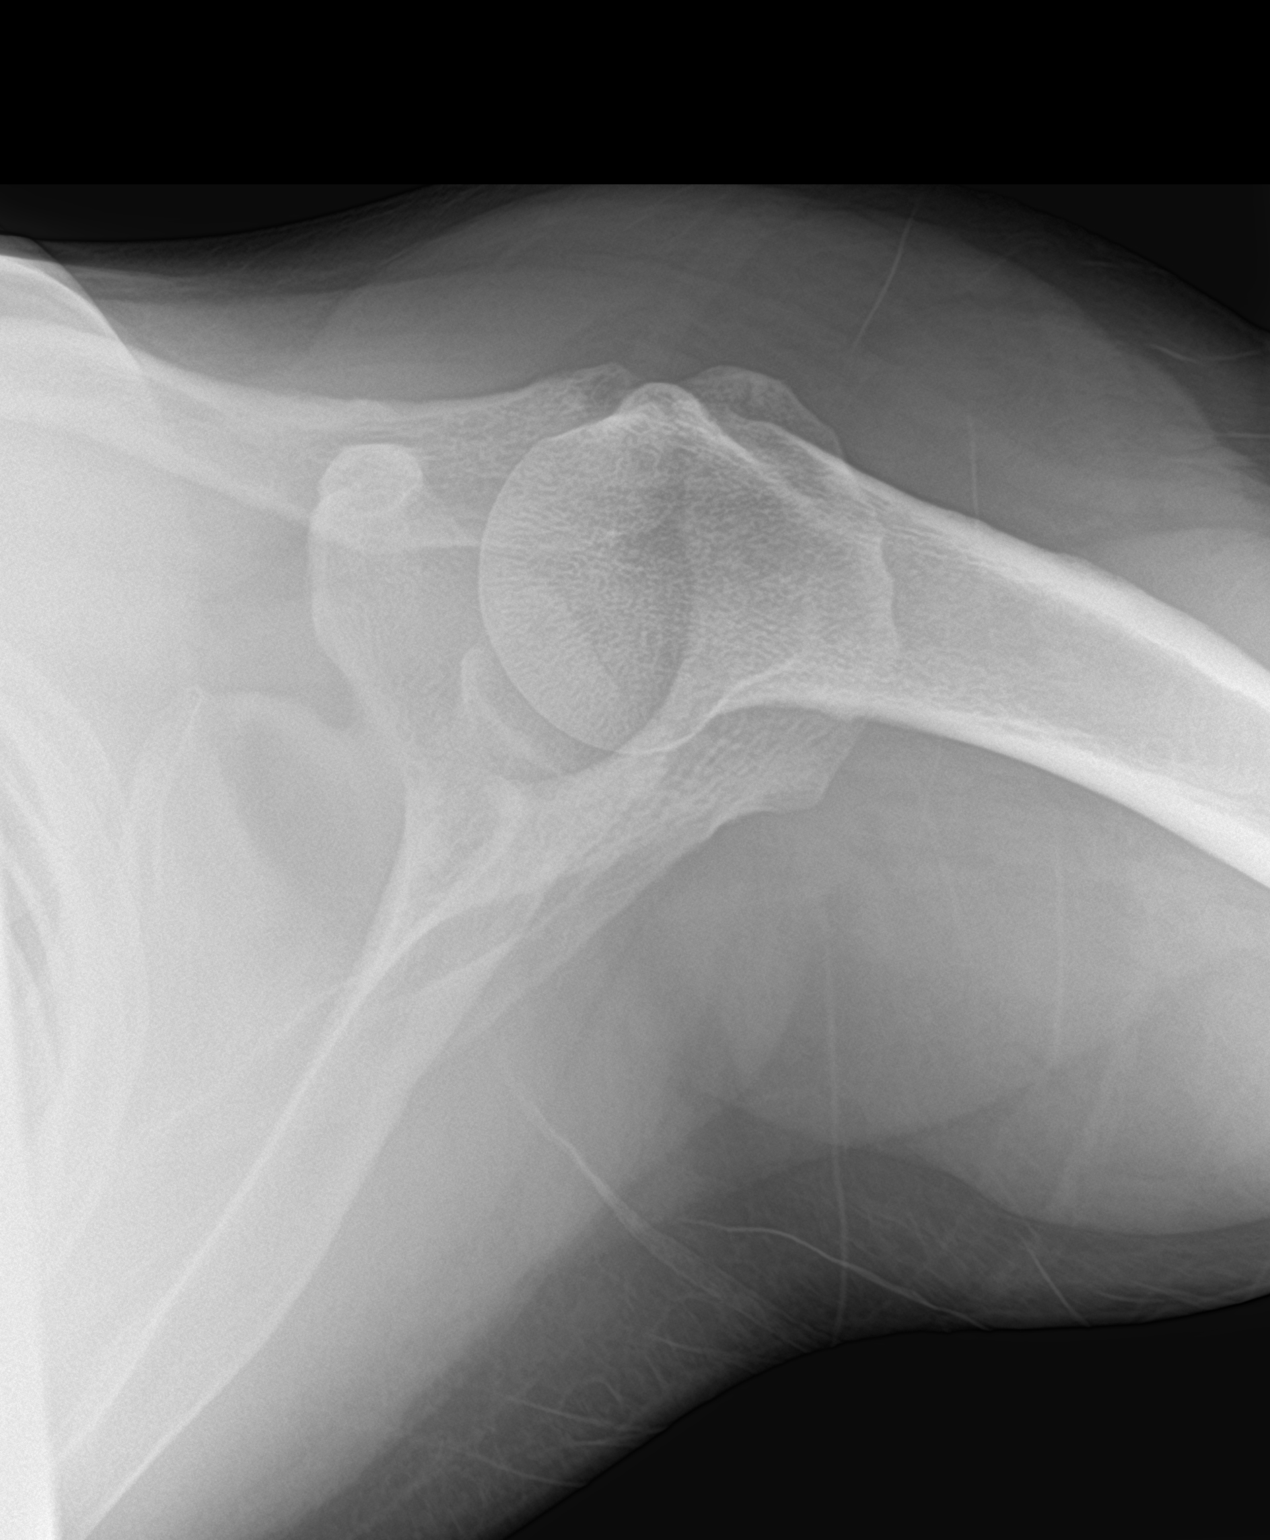

[3 of 3 positions shown; findings below may reference images not displayed]

FINDINGS: There is no evidence of fracture or dislocation. There is no
evidence of arthropathy or other focal bone abnormality. Soft
tissues are unremarkable.
IMPRESSION: Negative.

## 2022-06-26 ENCOUNTER — Encounter (HOSPITAL_COMMUNITY): Payer: Self-pay

## 2022-06-26 ENCOUNTER — Ambulatory Visit (HOSPITAL_COMMUNITY)
Admission: RE | Admit: 2022-06-26 | Discharge: 2022-06-26 | Disposition: A | Discharge status: Home / Self Care | Payer: BC Managed Care – PPO | Source: Ambulatory Visit | Attending: Family Medicine | Admitting: Family Medicine

## 2022-06-26 VITALS — BP 162/85 | HR 80 | Temp 98.4°F | Resp 18

## 2022-06-26 DIAGNOSIS — L309 Dermatitis, unspecified: Secondary | ICD-10-CM | POA: Diagnosis not present

## 2022-06-26 DIAGNOSIS — E1165 Type 2 diabetes mellitus with hyperglycemia: Secondary | ICD-10-CM | POA: Diagnosis not present

## 2022-06-26 LAB — POCT URINALYSIS DIP (MANUAL ENTRY)
Bilirubin, UA: NEGATIVE
Blood, UA: NEGATIVE
Glucose, UA: >=1000 mg/dL — AB
Leukocytes, UA: NEGATIVE
Nitrite, UA: NEGATIVE
Spec Grav, UA: 1.025 (ref 1.010–1.025)
Urobilinogen, UA: 1 E.U./dL
pH, UA: 6 (ref 5.0–8.0)

## 2022-06-26 LAB — POCT FASTING CBG KUC MANUAL ENTRY: POCT Glucose (KUC): 257 mg/dL — AB (ref 70–99)

## 2022-06-26 MED ORDER — METFORMIN HCL 1000 MG PO TABS: 1000.0000 mg | ORAL_TABLET | Freq: Two times a day (BID) | ORAL | 2 refills | Status: DC

## 2022-06-26 MED ORDER — TRIAMCINOLONE ACETONIDE 0.1 % EX CREA: 1.0000 | TOPICAL_CREAM | Freq: Two times a day (BID) | CUTANEOUS | 0 refills | Status: AC

## 2022-06-26 NOTE — ED Triage Notes (Signed)
Pt reports rash on both hands x 1 month. Pt reports he uses gloves for work.  Pt reports dysuria; he is not taking any of his diabetes or hypertension medication.

## 2022-06-26 NOTE — ED Provider Notes (Signed)
[facility]    CSN: [id number] Arrival date & time: 06/26/22  0813      History   Chief Complaint Chief Complaint  Patient presents with   Rash    Also excessive peeing - Entered by patient   Dysuria    HPI Brendan Hodges is a 42 y.o. male.    Rash Dysuria Presenting symptoms: dysuria    Here for rash on the dorsum of both hands.  Is been bothering him in the last week.  He got better and then worsened again.  He does wear work gloves and he wonders if getting perspiration under his gloves might of contributed.  They are not very itchy. No fever or chills  He has been having urinary frequency for about 1 month.  He has had no dysuria or hematuria.  No penile itching or discharge.  He does have a history of diabetes, but has not been taking his medication for that or for blood pressure for some time.  He states he has not been doing as well in his diet in the last month, and has been having the urinary frequency.  He has not had a primary care since he just got his insurance reinstated Past Medical History:  Diagnosis Date   Diabetes mellitus without complication (HCC)    Hypertension     Patient Active Problem List   Diagnosis Date Noted   Diabetes mellitus without complication (HCC) 04/13/2017    History reviewed. No pertinent surgical history.     Home Medications    Prior to Admission medications   Medication Sig Start Date End Date Taking? Authorizing Provider  triamcinolone cream (KENALOG) 0.1 % Apply 1 Application topically 2 (two) times daily. To affected area till better 06/26/22  Yes Irini Leet, Janace Aris, MD  BD MICROTAINER LANCETS MISC For checking blood sugar 3 times per day, more for hyper or hypoglycemia. 10/27/16   Deeann Saint, MD  Blood Gluc Meter Disp-Strips (BLOOD GLUCOSE METER DISPOSABLE) DEVI For testing TID, more frequently if hypo or hyperglycemic. 10/27/16   Deeann Saint, MD  blood glucose meter kit and supplies KIT  Dispense based on patient and insurance preference. Use up to four times daily as directed. (FOR ICD-9 250.00, 250.01). 06/07/19   Mardella Layman, MD  Blood Pressure Monitoring (BLOOD PRESSURE CUFF) MISC Use as directed daily. 08/07/19   Deeann Saint, MD  lisinopril (ZESTRIL) 2.5 MG tablet Take 1 tablet by mouth once daily 12/08/20   Deeann Saint, MD  metFORMIN (GLUCOPHAGE) 1000 MG tablet Take 1 tablet (1,000 mg total) by mouth 2 (two) times daily. 06/26/22   Zenia Resides, MD  ONETOUCH VERIO test strip USE TO TEST BLOOD SUGAR TID AND IF HYPO OR HYPERGLYCEMIC 08/07/19   Deeann Saint, MD    Family History Family History  Problem Relation Age of Onset   Breast cancer Mother    Diabetes Mother    Hypertension Mother    Prostate cancer Father    Diabetes Father    Diabetes Brother    Hypertension Brother    Hypertension Maternal Grandmother    Hypertension Paternal Grandmother     Social History Social History   Tobacco Use   Smoking status: Every Day   Smokeless tobacco: Never  Vaping Use   Vaping Use: Never used  Substance Use Topics   Alcohol use: Yes    Comment: weekends   Drug use: Never     Allergies  Patient has no known allergies.   Review of Systems Review of Systems  Genitourinary:  Positive for dysuria.  Skin:  Positive for rash.     Physical Exam Triage Vital Signs ED Triage Vitals [06/26/22 0840]  Enc Vitals Group     BP (!) 162/85     Pulse Rate 80     Resp 18     Temp 98.4 F (36.9 C)     Temp Source Oral     SpO2 97 %     Weight      Height      Head Circumference      Peak Flow      Pain Score      Pain Loc      Pain Edu?      Excl. in GC?    No data found.  Updated Vital Signs BP (!) 162/85 (BP Location: Left Arm)   Pulse 80   Temp 98.4 F (36.9 C) (Oral)   Resp 18   SpO2 97%   Visual Acuity Right Eye Distance:   Left Eye Distance:   Bilateral Distance:    Right Eye Near:   Left Eye Near:    Bilateral Near:      Physical Exam Vitals reviewed.  Constitutional:      General: He is not in acute distress.    Appearance: He is not ill-appearing, toxic-appearing or diaphoretic.  HENT:     Mouth/Throat:     Mouth: Mucous membranes are moist.  Eyes:     Extraocular Movements: Extraocular movements intact.     Pupils: Pupils are equal, round, and reactive to light.  Cardiovascular:     Rate and Rhythm: Normal rate and regular rhythm.     Heart sounds: No murmur heard. Pulmonary:     Effort: Pulmonary effort is normal.     Breath sounds: Normal breath sounds.  Skin:    Coloration: Skin is not pale.  Neurological:     General: No focal deficit present.     Mental Status: He is alert.  Psychiatric:        Behavior: Behavior normal.      UC Treatments / Results  Labs (all labs ordered are listed, but only abnormal results are displayed) Labs Reviewed  POCT FASTING CBG [facility] MANUAL ENTRY - Abnormal; Notable for the following components:      Result Value   POCT Glucose ([facility]) 257 (*)    All other components within normal limits  POCT URINALYSIS DIP (MANUAL ENTRY) - Abnormal; Notable for the following components:   Glucose, UA >=1,000 (*)    Ketones, POC UA trace (5) (*)    Protein Ur, POC trace (*)    All other components within normal limits    EKG   Radiology No results found.  Procedures Procedures (including critical care time)  Medications Ordered in UC Medications - No data to display  Initial Impression / Assessment and Plan / UC Course  I have reviewed the triage vital signs and the nursing notes.  Pertinent labs & imaging results that were available during my care of the patient were reviewed by me and considered in my medical decision making (see chart for details).        Urinalysis shows 1000 mg percent of sugar, trace ketones, and trace of protein.  Fingerstick glucose is 257.  Triamcinolone is sent in for the dermatitis, and his metformin is sent in to  restart for his diabetes.  He  is given instructions on how to set up primary care appointment on the [facility] health website Final Clinical Impressions(s) / UC Diagnoses   Final diagnoses:  Dermatitis  Controlled type 2 diabetes mellitus with hyperglycemia, without long-term current use of insulin (HCC)     Discharge Instructions      Urine did have a lot of sugar in it, nothing that is why you are urinating so frequently.  Your fingerstick sugar was 257   Apply triamcinolone cream to the rash 2 times daily until better.  Keeping your gloves dry at work can help.  Metformin 1000 mg--1 tablet 2 times daily for diabetes.  Do not skip meals and eat regularly, but do avoid concentrated sugars and sweets or sugary drinks, and eat less starch.  You can use the QR code/website at the back of the summary paperwork to schedule yourself a new patient appointment with primary care      ED Prescriptions     Medication Sig Dispense Auth. Provider   metFORMIN (GLUCOPHAGE) 1000 MG tablet Take 1 tablet (1,000 mg total) by mouth 2 (two) times daily. 60 tablet Zenia Resides, MD   triamcinolone cream (KENALOG) 0.1 % Apply 1 Application topically 2 (two) times daily. To affected area till better 80 g Marlinda Mike Janace Aris, MD      PDMP not reviewed this encounter.   Zenia Resides, MD 06/26/22 [phone]

## 2022-06-26 NOTE — Discharge Instructions (Signed)
Urine did have a lot of sugar in it, nothing that is why you are urinating so frequently.  Your fingerstick sugar was 257   Apply triamcinolone cream to the rash 2 times daily until better.  Keeping your gloves dry at work can help.  Metformin 1000 mg--1 tablet 2 times daily for diabetes.  Do not skip meals and eat regularly, but do avoid concentrated sugars and sweets or sugary drinks, and eat less starch.  You can use the QR code/website at the back of the summary paperwork to schedule yourself a new patient appointment with primary care

## 2023-06-07 ENCOUNTER — Emergency Department (HOSPITAL_BASED_OUTPATIENT_CLINIC_OR_DEPARTMENT_OTHER)
Admission: EM | Admit: 2023-06-07 | Discharge: 2023-06-07 | Disposition: A | Discharge status: Home / Self Care | Payer: Self-pay | Attending: Emergency Medicine | Admitting: Emergency Medicine

## 2023-06-07 ENCOUNTER — Emergency Department (HOSPITAL_BASED_OUTPATIENT_CLINIC_OR_DEPARTMENT_OTHER): Payer: Self-pay

## 2023-06-07 ENCOUNTER — Other Ambulatory Visit (HOSPITAL_BASED_OUTPATIENT_CLINIC_OR_DEPARTMENT_OTHER): Payer: Self-pay

## 2023-06-07 ENCOUNTER — Encounter (HOSPITAL_BASED_OUTPATIENT_CLINIC_OR_DEPARTMENT_OTHER): Payer: Self-pay | Admitting: Emergency Medicine

## 2023-06-07 ENCOUNTER — Other Ambulatory Visit: Payer: Self-pay

## 2023-06-07 DIAGNOSIS — R739 Hyperglycemia, unspecified: Secondary | ICD-10-CM

## 2023-06-07 DIAGNOSIS — R079 Chest pain, unspecified: Secondary | ICD-10-CM | POA: Insufficient documentation

## 2023-06-07 DIAGNOSIS — E1165 Type 2 diabetes mellitus with hyperglycemia: Secondary | ICD-10-CM | POA: Insufficient documentation

## 2023-06-07 DIAGNOSIS — Z7984 Long term (current) use of oral hypoglycemic drugs: Secondary | ICD-10-CM | POA: Insufficient documentation

## 2023-06-07 LAB — BASIC METABOLIC PANEL WITH GFR
Anion gap: 16 — ABNORMAL HIGH (ref 5–15)
BUN: 10 mg/dL (ref 6–20)
CO2: 21 mmol/L — ABNORMAL LOW (ref 22–32)
Calcium: 9.4 mg/dL (ref 8.9–10.3)
Chloride: 99 mmol/L (ref 98–111)
Creatinine, Ser: 0.94 mg/dL (ref 0.61–1.24)
GFR, Estimated: >60 mL/min (ref >60)
Glucose, Bld: 465 mg/dL — ABNORMAL HIGH (ref 70–99)
Potassium: 3.7 mmol/L (ref 3.5–5.1)
Sodium: 136 mmol/L (ref 135–145)

## 2023-06-07 LAB — CBC
HCT: 44.8 % (ref 39.0–52.0)
Hemoglobin: 15.9 g/dL (ref 13.0–17.0)
MCH: 31.2 pg (ref 26.0–34.0)
MCHC: 35.5 g/dL (ref 30.0–36.0)
MCV: 88 fL (ref 80.0–100.0)
Platelets: 155 10*3/uL (ref 150–400)
RBC: 5.09 MIL/uL (ref 4.22–5.81)
RDW: 12 % (ref 11.5–15.5)
WBC: 6 10*3/uL (ref 4.0–10.5)
nRBC: 0 % (ref 0.0–0.2)

## 2023-06-07 LAB — D-DIMER, QUANTITATIVE: D-Dimer, Quant: <0.27 ug{FEU}/mL (ref 0.00–0.50)

## 2023-06-07 LAB — CBG MONITORING, ED: Glucose-Capillary: 252 mg/dL — ABNORMAL HIGH (ref 70–99)

## 2023-06-07 LAB — TROPONIN T, HIGH SENSITIVITY: Troponin T High Sensitivity: <15 ng/L (ref <19)

## 2023-06-07 MED ORDER — INSULIN ASPART 100 UNIT/ML IJ SOLN
5.0000 [IU] | Freq: Once | INTRAMUSCULAR | Status: AC
  Administered 2023-06-07: 5 [IU] via SUBCUTANEOUS

## 2023-06-07 MED ORDER — METFORMIN HCL 1000 MG PO TABS
1000.0000 mg | ORAL_TABLET | Freq: Two times a day (BID) | ORAL | 2 refills | Status: AC
  Filled 2023-06-07: qty 60, 30d supply, fill #0
  Filled 2023-08-26: qty 60, 30d supply, fill #1
  Filled 2023-10-29: qty 60, 30d supply, fill #2

## 2023-06-07 MED ORDER — SODIUM CHLORIDE 0.9 % IV BOLUS
1000.0000 mL | Freq: Once | INTRAVENOUS | Status: AC
  Administered 2023-06-07: 1000 mL via INTRAVENOUS

## 2023-06-07 NOTE — ED Notes (Signed)

## 2023-06-07 NOTE — ED Provider Notes (Signed)
 [facility] Provider Note   CSN: [id number] Arrival date & time: 06/07/23  1114     History  Chief Complaint  Patient presents with   Chest Pain    Brendan Hodges is a 43 y.o. male.  Patient here with chest pain.  Comes and goes at times.  No shortness of breath no nausea vomit diarrhea.  Feels like his blood sugar is not being well-controlled is not taking his metformin .  He denies any exertional pain.  Denies any recent surgery or travel.  May be worse with movement.  Denies fever cough sputum production.  No abdominal pain nausea vomit diarrhea.  The history is provided by the patient.       Home Medications Prior to Admission medications   Medication Sig Start Date End Date Taking? Authorizing Provider  BD MICROTAINER LANCETS MISC For checking blood sugar 3 times per day, more for hyper or hypoglycemia. 10/27/16   Viola Greulich, MD  Blood Gluc Meter Disp-Strips (BLOOD GLUCOSE METER DISPOSABLE) DEVI For testing TID, more frequently if hypo or hyperglycemic. 10/27/16   Viola Greulich, MD  blood glucose meter kit and supplies KIT Dispense based on patient and insurance preference. Use up to four times daily as directed. (FOR ICD-9 250.00, 250.01). 06/07/19   Afton Albright, MD  Blood Pressure Monitoring (BLOOD PRESSURE CUFF) MISC Use as directed daily. 08/07/19   Viola Greulich, MD  lisinopril  (ZESTRIL ) 2.5 MG tablet Take 1 tablet by mouth once daily 12/08/20   Viola Greulich, MD  metFORMIN  (GLUCOPHAGE ) 1000 MG tablet Take 1 tablet (1,000 mg total) by mouth 2 (two) times daily. 06/07/23   [employer]elle [employer], DO  ONETOUCH VERIO test strip USE TO TEST BLOOD SUGAR TID AND IF HYPO OR HYPERGLYCEMIC 08/07/19   Viola Greulich, MD  triamcinolone  cream (KENALOG ) 0.1 % Apply 1 Application topically 2 (two) times daily. To affected area till better 06/26/22   Ann Keto, MD      Allergies    Patient has no known allergies.    Review of  Systems   Review of Systems  Physical Exam Updated Vital Signs BP (!) 154/102 (BP Location: Left Arm)   Pulse (!) 102   Temp 98.2 F (36.8 C) (Oral)   Resp 17   Ht 5\' 10"  (1.778 m)   Wt 103.4 kg   SpO2 99%   BMI 32.71 kg/m  Physical Exam Vitals and nursing note reviewed.  Constitutional:      General: He is not in acute distress.    Appearance: He is well-developed. He is not ill-appearing.  HENT:     Head: Normocephalic and atraumatic.  Eyes:     Extraocular Movements: Extraocular movements intact.     Conjunctiva/sclera: Conjunctivae normal.     Pupils: Pupils are equal, round, and reactive to light.  Cardiovascular:     Rate and Rhythm: Normal rate and regular rhythm.     Pulses:          Radial pulses are 2+ on the right side and 2+ on the left side.     Heart sounds: Normal heart sounds. No murmur heard. Pulmonary:     Effort: Pulmonary effort is normal. No respiratory distress.     Breath sounds: Normal breath sounds.  Abdominal:     Palpations: Abdomen is soft.     Tenderness: There is no abdominal tenderness.  Musculoskeletal:        General:  No swelling.     Cervical back: Normal range of motion and neck supple.  Skin:    General: Skin is warm and dry.     Capillary Refill: Capillary refill takes less than 2 seconds.  Neurological:     General: No focal deficit present.     Mental Status: He is alert.  Psychiatric:        Mood and Affect: Mood normal.     ED Results / Procedures / Treatments   Labs (all labs ordered are listed, but only abnormal results are displayed) Labs Reviewed  BASIC METABOLIC PANEL WITH GFR - Abnormal; Notable for the following components:      Result Value   CO2 21 (*)    Glucose, Bld 465 (*)    Anion gap 16 (*)    All other components within normal limits  CBG MONITORING, ED - Abnormal; Notable for the following components:   Glucose-Capillary 252 (*)    All other components within normal limits  CBC  D-DIMER,  QUANTITATIVE  TROPONIN T, HIGH SENSITIVITY  TROPONIN T, HIGH SENSITIVITY    EKG EKG Interpretation Date/Time:  Thursday Jun 07 2023 11:22:05 EDT Ventricular Rate:  101 PR Interval:  171 QRS Duration:  94 QT Interval:  319 QTC Calculation: 414 R Axis:   16  Text Interpretation: Sinus tachycardia Low voltage, precordial leads Confirmed by Lowery Rue [phone]) on 06/07/2023 11:38:05 AM  Radiology DG Chest 2 View Result Date: 06/07/2023 CLINICAL DATA:  Chest pain EXAM: CHEST - 2 VIEW COMPARISON:  Chest radiograph dated 12/07/2014 FINDINGS: Normal lung volumes. Left upper lung peripheral linear opacities. Hazy rounded opacity projecting over the left mid lung. No pleural effusion or pneumothorax. The heart size and mediastinal contours are within normal limits. No acute osseous abnormality. IMPRESSION: 1. Left upper lung peripheral linear opacities, likely atelectasis/scarring. No focal consolidations. 2. Hazy rounded opacity projecting over the left mid lung is favored to reflect an external cardiac monitoring patch. Electronically Signed   By: Limin  Xu M.D.   On: 06/07/2023 12:06    Procedures Procedures    Medications Ordered in ED Medications  sodium chloride 0.9 % bolus 1,000 mL (0 mLs Intravenous Stopped 06/07/23 1322)  insulin  aspart (novoLOG) injection 5 Units (5 Units Subcutaneous Given 06/07/23 1234)    ED Course/ Medical Decision Making/ A&P                                 Medical Decision Making Amount and/or Complexity of Data Reviewed Labs: ordered. Radiology: ordered.  Risk Prescription drug management.   Brendan Hodges is here with chest pain high blood sugar.  Normal vitals.  No fever.  Atypical story for ACS.  Has history of diabetes high blood pressure.  Unremarkable vitals otherwise here today.  Well-appearing.  No active pain.  Does not seem exertional.  He has a heart score of 2.  EKG shows sinus rhythm.  No ischemic changes.  Will check basic labs  occluding troponin chest x-ray will check blood sugar.  Will give IV fluid bolus.  Reevaluate.  Differential diagnosis likely MSK related pain or GI related pain.  Will evaluate for ACS electrolyte abnormality kidney injury electrolyte abnormality.  He has no PE risk factor he is mildly tachycardic we will check a D-dimer.  D-dimer was unremarkable.  Blood sugar improved to 252.  Follow-up with primary care doctor.  Restarted metformin .  Discharge.  No concern for ACS or other acute process at this time.  No significant leukocytosis anemia or electrolyte abnormality otherwise.  Troponin normal.  Noncardiac sounding pain.  Understands return precautions.  This chart was dictated using voice recognition software.  Despite best efforts to proofread,  errors can occur which can change the documentation meaning.         Final Clinical Impression(s) / ED Diagnoses Final diagnoses:  Nonspecific chest pain  Hyperglycemia    Rx / DC Orders ED Discharge Orders          Ordered    metFORMIN  (GLUCOPHAGE ) 1000 MG tablet  2 times daily        06/07/23 1238              Kemaria Dedic, DO 06/07/23 1329

## 2023-06-07 NOTE — ED Triage Notes (Signed)
 Pt POV steady gait- c/o L CP earlier this week, subsided and then returned today. Denies ShOB, n/v/diaphoresis.   Pt reports uncontrolled diabetes, not currently taking metformin .

## 2023-06-07 NOTE — Discharge Instructions (Addendum)
 Restart metformin .  Recommend Tylenol ibuprofen  for your chest pain.  Workup today was unremarkable.  Follow-up with your primary care doctor.  Try to monitor your blood sugars at home with your persistently elevated above 500 you should consider reevaluation.

## 2023-08-27 ENCOUNTER — Other Ambulatory Visit (HOSPITAL_BASED_OUTPATIENT_CLINIC_OR_DEPARTMENT_OTHER): Payer: Self-pay

## 2023-10-29 ENCOUNTER — Other Ambulatory Visit: Payer: Self-pay
